# Patient Record
Sex: Male | Born: 1990 | Race: Black or African American | Hispanic: No | Marital: Single | Smoking: Current every day smoker
Health system: Southern US, Community
[De-identification: ages and names within clinical notes are randomized; demographics above are authoritative.]

## PROBLEM LIST (undated history)

## (undated) DIAGNOSIS — I456 Pre-excitation syndrome: Secondary | ICD-10-CM

## (undated) DIAGNOSIS — J45909 Unspecified asthma, uncomplicated: Secondary | ICD-10-CM

## (undated) HISTORY — PX: ATRIAL FLUTTER ABLATION: SHX5733

---

## 2016-02-19 ENCOUNTER — Emergency Department: Payer: Self-pay

## 2016-02-19 ENCOUNTER — Encounter: Payer: Self-pay | Admitting: Emergency Medicine

## 2016-02-19 ENCOUNTER — Emergency Department
Admission: EM | Admit: 2016-02-19 | Discharge: 2016-02-20 | Payer: Self-pay | Attending: Emergency Medicine | Admitting: Emergency Medicine

## 2016-02-19 DIAGNOSIS — J45909 Unspecified asthma, uncomplicated: Secondary | ICD-10-CM | POA: Insufficient documentation

## 2016-02-19 DIAGNOSIS — R55 Syncope and collapse: Secondary | ICD-10-CM

## 2016-02-19 DIAGNOSIS — I456 Pre-excitation syndrome: Secondary | ICD-10-CM | POA: Insufficient documentation

## 2016-02-19 DIAGNOSIS — F17210 Nicotine dependence, cigarettes, uncomplicated: Secondary | ICD-10-CM | POA: Insufficient documentation

## 2016-02-19 HISTORY — DX: Unspecified asthma, uncomplicated: J45.909

## 2016-02-19 HISTORY — DX: Pre-excitation syndrome: I45.6

## 2016-02-19 LAB — BASIC METABOLIC PANEL
ANION GAP: 12 (ref 5–15)
BUN: 15 mg/dL (ref 6–20)
CHLORIDE: 100 mmol/L — AB (ref 101–111)
CO2: 26 mmol/L (ref 22–32)
Calcium: 9.8 mg/dL (ref 8.9–10.3)
Creatinine, Ser: 1.12 mg/dL (ref 0.61–1.24)
GFR calc non Af Amer: >60 mL/min (ref >60)
Glucose, Bld: 82 mg/dL (ref 65–99)
POTASSIUM: 3.5 mmol/L (ref 3.5–5.1)
SODIUM: 138 mmol/L (ref 135–145)

## 2016-02-19 LAB — TROPONIN I

## 2016-02-19 MED ORDER — LORAZEPAM 2 MG/ML IJ SOLN
1.0000 mg | Freq: Once | INTRAMUSCULAR | Status: AC
  Administered 2016-02-19: 1 mg via INTRAVENOUS
  Filled 2016-02-19: qty 1

## 2016-02-19 MED ORDER — LORAZEPAM 2 MG/ML IJ SOLN
0.5000 mg | Freq: Once | INTRAMUSCULAR | Status: AC
  Administered 2016-02-19: 0.5 mg via INTRAVENOUS
  Filled 2016-02-19: qty 1

## 2016-02-19 NOTE — ED Triage Notes (Signed)
Per [facility]: Girtlfriend called 911 because patient passed out twice in kitchen making dinner, hx WPW, CBG 89, VS stable, sharp chest pain Left and center

## 2016-02-19 NOTE — ED Provider Notes (Signed)
[facility] Emergency Department Provider Note  ____________________________________________  Time seen: Approximately 11:25 PM  I have reviewed the triage vital signs and the nursing notes.   HISTORY  Chief Complaint Wolff-Parkinson-White Syndrome    HPI Zachary Perez is a 25 y.o. male brought to the ED due to syncope. He has a history of WPW, diagnosed when he was in high school and had some exertional cardiac symptoms he was seen in the hospital at that time, recommended to have an ablation, but never followed up. Tonight he was starting to cook some dinner, when he had sudden onset of left-sided chest pain, sharp, nonradiating, not pleuritic, followed quickly by syncope. After waking up he had syncope again. EMS were called and found the patient had recovered by the time they arrived. Twelve-lead EKG at that time was unremarkable.   Patient reports ongoing moderate chest pain     Past Medical History:  Diagnosis Date  . Asthma   . Wolff-Parkinson-White (WPW) syndrome      There are no active problems to display for this patient.    History reviewed. No pertinent surgical history.   Prior to Admission medications   Not on File     Allergies Patient has no known allergies.   No family history on file.  Social History Social History  Substance Use Topics  . Smoking status: Current Every Day Smoker    Packs/day: 0.50    Types: Cigarettes  . Smokeless tobacco: Never Used  . Alcohol use Not on file    Review of Systems  Constitutional:   No fever or chills.  ENT:   No sore throat. No rhinorrhea. Cardiovascular:   Positive as above chest pain. Respiratory:   No dyspnea or cough. Gastrointestinal:   Negative for abdominal pain, vomiting and diarrhea.  Neurological:   Negative for headaches 10-point ROS otherwise negative.  ____________________________________________   PHYSICAL EXAM:  VITAL SIGNS: ED Triage Vitals  Enc  Vitals Group     BP 02/19/16 2220 (!) 164/92     Pulse Rate 02/19/16 2220 77     Resp 02/19/16 2220 17     Temp 02/19/16 2220 98.8 F (37.1 C)     Temp Source 02/19/16 2220 Oral     SpO2 02/19/16 2220 100 %     Weight 02/19/16 2221 150 lb (68 kg)     Height 02/19/16 2221 6' (1.829 m)     Head Circumference --      Peak Flow --      Pain Score 02/19/16 2223 10     Pain Loc --      Pain Edu? --      Excl. in GC? --     Vital signs reviewed, nursing assessments reviewed.   Constitutional:   Alert and oriented. Well appearing and in no distress. Eyes:   No scleral icterus. No conjunctival pallor. PERRL. EOMI.  No nystagmus. ENT   Head:   Normocephalic and atraumatic.   Nose:   No congestion/rhinnorhea. No septal hematoma   Mouth/Throat:   MMM, no pharyngeal erythema. No peritonsillar mass.    Neck:   No stridor. No SubQ emphysema. No meningismus. Hematological/Lymphatic/Immunilogical:   No cervical lymphadenopathy. Cardiovascular:   RRR. Symmetric bilateral radial and DP pulses.  No murmurs.  Respiratory:   Normal respiratory effort without tachypnea nor retractions. Breath sounds are clear and equal bilaterally. No wheezes/rales/rhonchi. Chest wall nontender Gastrointestinal:   Soft and nontender. Non distended. There is no CVA  tenderness.  No rebound, rigidity, or guarding. Genitourinary:   deferred Musculoskeletal:   Nontender with normal range of motion in all extremities. No joint effusions.  No lower extremity tenderness.  No edema. Neurologic:   Normal speech and language.  CN 2-10 normal. Motor grossly intact. No gross focal neurologic deficits are appreciated.  Skin:    Skin is warm, dry and intact. No rash noted.  No petechiae, purpura, or bullae.  ____________________________________________    LABS (pertinent positives/negatives) (all labs ordered are listed, but only abnormal results are displayed) Labs Reviewed  BASIC METABOLIC PANEL  CBC WITH  DIFFERENTIAL/PLATELET  TROPONIN I   ____________________________________________   EKG  Interpreted by me Sinus tachycardia rate 119, normal axis and intervals. Pronounced alto wave on R wave upslope in inferior and anterolateral leads. Normal ST segments and T waves.  ____________________________________________    RADIOLOGY  Chest x-ray unremarkable  ____________________________________________   PROCEDURES Procedures CRITICAL CARE Performed by: Scotty CourtSTAFFORD, Mikeisha Lemonds   Total critical care time: 35 minutes  Critical care time was exclusive of separately billable procedures and treating other patients.  Critical care was necessary to treat or prevent imminent or life-threatening deterioration.  Critical care was time spent personally by me on the following activities: development of treatment plan with patient and/or surrogate as well as nursing, discussions with consultants, evaluation of patient's response to treatment, examination of patient, obtaining history from patient or surrogate, ordering and performing treatments and interventions, ordering and review of laboratory studies, ordering and review of radiographic studies, pulse oximetry and re-evaluation of patient's condition.  ____________________________________________   INITIAL IMPRESSION / ASSESSMENT AND PLAN / ED COURSE  Pertinent labs & imaging results that were available during my care of the patient were reviewed by me and considered in my medical decision making (see chart for details).  Patient presents with syncope 2, EKG diagnostic of WPW. Has ongoing chest pain. Vital signs now stabilized. No pharmacologic interventions at this time. Discussed with on-call [facility]ology for [facility], Dr. Dortha SchwalbeIngal,who notes the patient would not be able to receive adequate evaluation here [facility]. Discussed transfer with patient, who [facility]. I then discussed the patient's case with the cardiology  [facility], who accepts to med telemetry under service of [employer] N[employer]ols.      Clinical Course    ____________________________________________   FINAL CLINICAL IMPRESSION(S) / ED DIAGNOSES  Final diagnoses:  WPW syndrome  Syncope, unspecified syncope type       Portions of this note were generated with dragon dictation software. Dictation errors may occur despite best attempts at proofreading.    Sharman CheekPhillip Mallie Giambra, MD 02/19/16 [id number]

## 2016-02-20 LAB — CBC WITH DIFFERENTIAL/PLATELET
Basophils Absolute: 0.1 10*3/uL (ref 0–0.1)
Basophils Relative: 1 %
EOS ABS: 0.2 10*3/uL (ref 0–0.7)
Eosinophils Relative: 1 %
HEMATOCRIT: 46.7 % (ref 40.0–52.0)
HEMOGLOBIN: 15.3 g/dL (ref 13.0–18.0)
LYMPHS ABS: 3.6 10*3/uL (ref 1.0–3.6)
LYMPHS PCT: 27 %
MCH: 27.2 pg (ref 26.0–34.0)
MCHC: 32.8 g/dL (ref 32.0–36.0)
MCV: 82.9 fL (ref 80.0–100.0)
MONOS PCT: 7 %
Monocytes Absolute: 0.9 10*3/uL (ref 0.2–1.0)
NEUTROS ABS: 8.8 10*3/uL — AB (ref 1.4–6.5)
NEUTROS PCT: 64 %
Platelets: 184 10*3/uL (ref 150–440)
RBC: 5.63 MIL/uL (ref 4.40–5.90)
RDW: 13 % (ref 11.5–14.5)
WBC: 13.6 10*3/uL — AB (ref 3.8–10.6)

## 2016-02-20 MED ORDER — MORPHINE SULFATE (PF) 4 MG/ML IV SOLN
2.0000 mg | Freq: Once | INTRAVENOUS | Status: AC
  Administered 2016-02-20: 2 mg via INTRAVENOUS
  Filled 2016-02-20: qty 1

## 2016-02-20 NOTE — ED Notes (Signed)
MD Manson PasseyBrown spoke with patient about necessity to stay in acute care due to critical condition of patient and necessity of cardiology follow-up. Patient wanted to leave AMA, elope, but has agreed to stay following Md Manson PasseyBrown discussion.

## 2016-02-20 NOTE — ED Provider Notes (Signed)
I assumed care of the patient 11:30 PM from Dr. Scotty CourtStafford. I was informed by Dr. Scotty CourtStafford that the [facility] was [facility] and was awaiting transport via [facility] I was notified by the nursing staff that the patient wanted to leave AGAINST MEDICAL ADVICE and was very irate. I spoke with the patient at length regarding the potential dangers in his decision. After a lengthy conversation the patient agreed to be [facility]. Upon [facility] arrival the patient again stated that he wanted to leave AGAINST MEDICAL ADVICE secondary to the fact that he was not pleased with the EMS crew. Again I spoke with the patient at length and [facility] agreed [facility]. Shortly after leaving we received a radio contact from the [facility] crew that they were back in route to the emergency department as the patient was very irate and removing his seatbelt while in transport. [facility] stated that the patient told them "distraught me off on the side of road". Patient return to the emergency department very irate and verbally abusive to the EMS staff. I spoke with the patient who stated that he believed the "people were racists". Again I spoke with the patient at length regarding the importance of need for admission. I told him of the potential, severe life threatening consequences of leaving AMA. After very  long conversation patient agreed to be transferred by another EMS crew. As such I called [place].  Critical Care: CRITICAL CARE Performed by: Darci CurrentANDOLPH N BROWN   Total critical care time: 30 minutes  Critical care time was exclusive of separately billable procedures and treating other patients.  Critical care was necessary to treat or prevent imminent or life-threatening deterioration.  Critical care was time spent personally by me on the following activities: development of treatment plan with patient and/or surrogate as well as nursing, discussions with consultants,  evaluation of patient's response to treatment, examination of patient, obtaining history from patient or surrogate, ordering and performing treatments and interventions, ordering and review of laboratory studies, ordering and review of radiographic studies, pulse oximetry and re-evaluation of patient's condition.    Darci Currentandolph N Brown, MD 02/20/16 [phone]

## 2016-02-27 LAB — GLUCOSE, CAPILLARY: Glucose-Capillary: 82 mg/dL (ref 65–99)

## 2016-03-18 ENCOUNTER — Encounter: Payer: Self-pay | Admitting: Emergency Medicine

## 2016-03-18 ENCOUNTER — Emergency Department
Admission: EM | Admit: 2016-03-18 | Discharge: 2016-03-18 | Disposition: A | Discharge status: Home / Self Care | Payer: Self-pay | Attending: Emergency Medicine | Admitting: Emergency Medicine

## 2016-03-18 DIAGNOSIS — R1084 Generalized abdominal pain: Secondary | ICD-10-CM | POA: Insufficient documentation

## 2016-03-18 DIAGNOSIS — F17210 Nicotine dependence, cigarettes, uncomplicated: Secondary | ICD-10-CM | POA: Insufficient documentation

## 2016-03-18 DIAGNOSIS — J45909 Unspecified asthma, uncomplicated: Secondary | ICD-10-CM | POA: Insufficient documentation

## 2016-03-18 DIAGNOSIS — R112 Nausea with vomiting, unspecified: Secondary | ICD-10-CM | POA: Insufficient documentation

## 2016-03-18 DIAGNOSIS — R197 Diarrhea, unspecified: Secondary | ICD-10-CM | POA: Insufficient documentation

## 2016-03-18 LAB — COMPREHENSIVE METABOLIC PANEL
ALK PHOS: 49 U/L (ref 38–126)
ALT: 31 U/L (ref 17–63)
ANION GAP: 10 (ref 5–15)
AST: 48 U/L — ABNORMAL HIGH (ref 15–41)
Albumin: 5.1 g/dL — ABNORMAL HIGH (ref 3.5–5.0)
BILIRUBIN TOTAL: 0.8 mg/dL (ref 0.3–1.2)
BUN: 11 mg/dL (ref 6–20)
CALCIUM: 9.8 mg/dL (ref 8.9–10.3)
CO2: 24 mmol/L (ref 22–32)
Chloride: 102 mmol/L (ref 101–111)
Creatinine, Ser: 0.88 mg/dL (ref 0.61–1.24)
Glucose, Bld: 109 mg/dL — ABNORMAL HIGH (ref 65–99)
POTASSIUM: 4.1 mmol/L (ref 3.5–5.1)
Sodium: 136 mmol/L (ref 135–145)
TOTAL PROTEIN: 8.2 g/dL — AB (ref 6.5–8.1)

## 2016-03-18 LAB — CBC WITH DIFFERENTIAL/PLATELET
BASOS PCT: 0 %
Basophils Absolute: 0 10*3/uL (ref 0–0.1)
Eosinophils Absolute: 0 10*3/uL (ref 0–0.7)
Eosinophils Relative: 0 %
HEMATOCRIT: 46.3 % (ref 40.0–52.0)
HEMOGLOBIN: 15.3 g/dL (ref 13.0–18.0)
LYMPHS ABS: 0.2 10*3/uL — AB (ref 1.0–3.6)
Lymphocytes Relative: 1 %
MCH: 27.1 pg (ref 26.0–34.0)
MCHC: 33 g/dL (ref 32.0–36.0)
MCV: 82.2 fL (ref 80.0–100.0)
MONO ABS: 0.6 10*3/uL (ref 0.2–1.0)
MONOS PCT: 3 %
NEUTROS ABS: 17.6 10*3/uL — AB (ref 1.4–6.5)
NEUTROS PCT: 96 %
Platelets: 190 10*3/uL (ref 150–440)
RBC: 5.64 MIL/uL (ref 4.40–5.90)
RDW: 12.9 % (ref 11.5–14.5)
WBC: 18.5 10*3/uL — ABNORMAL HIGH (ref 3.8–10.6)

## 2016-03-18 LAB — TROPONIN I

## 2016-03-18 LAB — LIPASE, BLOOD: LIPASE: 14 U/L (ref 11–51)

## 2016-03-18 MED ORDER — DICYCLOMINE HCL 10 MG PO CAPS
10.0000 mg | ORAL_CAPSULE | Freq: Once | ORAL | Status: AC
  Administered 2016-03-18: 10 mg via ORAL
  Filled 2016-03-18: qty 1

## 2016-03-18 MED ORDER — ONDANSETRON HCL 4 MG PO TABS: 4.0000 mg | ORAL_TABLET | Freq: Three times a day (TID) | ORAL | 0 refills | Status: AC | PRN

## 2016-03-18 MED ORDER — ONDANSETRON 4 MG PO TBDP: 4.0000 mg | ORAL_TABLET | Freq: Once | ORAL | Status: DC

## 2016-03-18 MED ORDER — SODIUM CHLORIDE 0.9 % IV BOLUS (SEPSIS)
1000.0000 mL | Freq: Once | INTRAVENOUS | Status: AC
  Administered 2016-03-18: 1000 mL via INTRAVENOUS

## 2016-03-18 MED ORDER — ONDANSETRON HCL 4 MG/2ML IJ SOLN
4.0000 mg | Freq: Once | INTRAMUSCULAR | Status: AC
  Administered 2016-03-18: 4 mg via INTRAVENOUS
  Filled 2016-03-18: qty 2

## 2016-03-18 MED ORDER — DICYCLOMINE HCL 20 MG PO TABS
20.0000 mg | ORAL_TABLET | Freq: Three times a day (TID) | ORAL | 0 refills | Status: AC | PRN
Start: 2016-03-18 — End: ?

## 2016-03-18 NOTE — Discharge Instructions (Signed)
Please seek medical attention for any high fevers, chest pain, shortness of breath, change in behavior, persistent vomiting, bloody stool or any other new or concerning symptoms.  

## 2016-03-18 NOTE — ED Notes (Signed)
Reviewed d/c instructions, follow-up care and prescriptions with patient. Patient verbalized understanding. Pt went to second floor to visit friend/admitted patient.

## 2016-03-18 NOTE — ED Provider Notes (Signed)
[facility] Emergency Department Provider Note   ____________________________________________   I have reviewed the triage vital signs and the nursing notes.   HISTORY  Chief Complaint Generalized Body Aches and Nausea   History limited by: Not Limited   HPI Zachary Perez is a 25 y.o. male who presents to the emergency department today because of concern for nausea vomiting and diarrhea. The symptoms started this morning. They woke him from his sleep. Has had multiple episodes of both vomiting and diarrhea, both non bloody. Has been accompanied by some abdominal discomfort. Patient states he ate chili last night from a fast food store. The patient denies any fevers.   Past Medical History:  Diagnosis Date  . Asthma   . Wolff-Parkinson-White (WPW) syndrome     There are no active problems to display for this patient.   Past Surgical History:  Procedure Laterality Date  . ATRIAL FLUTTER ABLATION      Prior to Admission medications   Not on File    Allergies Patient has no known allergies.  No family history on file.  Social History Social History  Substance Use Topics  . Smoking status: Current Every Day Smoker    Packs/day: 0.50    Types: Cigarettes  . Smokeless tobacco: Never Used  . Alcohol use Not on file    Review of Systems  Constitutional: Negative for fever. Cardiovascular: Negative for chest pain. Respiratory: Negative for shortness of breath. Gastrointestinal: Positive for abdominal pain, vomiting and diarrhea. Genitourinary: Negative for dysuria. Musculoskeletal: Negative for back pain. Skin: Negative for rash. Neurological: Negative for headaches, focal weakness or numbness.  10-point ROS otherwise negative.  ____________________________________________   PHYSICAL EXAM:  VITAL SIGNS: ED Triage Vitals  Enc Vitals Group     BP 03/18/16 1441 138/66     Pulse Rate 03/18/16 1441 67     Resp 03/18/16 1441 16      Temp 03/18/16 1441 97.8 F (36.6 C)     Temp src --      SpO2 03/18/16 1441 100 %     Weight 03/18/16 1441 145 lb (65.8 kg)     Height 03/18/16 1441 6' (1.829 m)     Head Circumference --      Peak Flow --      Pain Score 03/18/16 1443 4     Pain Loc --      Pain Edu? --      Excl. in GC? --     Constitutional: Alert and oriented. Well appearing and in no distress. Eyes: Conjunctivae are normal. Normal extraocular movements. ENT   Head: Normocephalic and atraumatic.   Nose: No congestion/rhinnorhea.   Mouth/Throat: Mucous membranes are moist.   Neck: No stridor. Hematological/Lymphatic/Immunilogical: No cervical lymphadenopathy. Cardiovascular: Normal rate, regular rhythm.  No murmurs, rubs, or gallops.  Respiratory: Normal respiratory effort without tachypnea nor retractions. Breath sounds are clear and equal bilaterally. No wheezes/rales/rhonchi. Gastrointestinal: Soft and Diffusely minimally tender to palpation. No rebound. No guarding.  Genitourinary: Deferred Musculoskeletal: Normal range of motion in all extremities. No lower extremity edema. Neurologic:  Normal speech and language. No gross focal neurologic deficits are appreciated.  Skin:  Skin is warm, dry and intact. No rash noted. Psychiatric: Mood and affect are normal. Speech and behavior are normal. Patient exhibits appropriate insight and judgment.  ____________________________________________    LABS (pertinent positives/negatives)  Labs Reviewed  CBC WITH DIFFERENTIAL/PLATELET - Abnormal; Notable for the following:       Result Value  WBC 18.5 (*)    Neutro Abs 17.6 (*)    Lymphs Abs 0.2 (*)    All other components within normal limits  COMPREHENSIVE METABOLIC PANEL - Abnormal; Notable for the following:    Glucose, Bld 109 (*)    Total Protein 8.2 (*)    Albumin 5.1 (*)    AST 48 (*)    All other components within normal limits  LIPASE, BLOOD  TROPONIN I      ____________________________________________   EKG  I, Phineas SemenGraydon Ramesh Moan, attending physician, personally viewed and interpreted this EKG  EKG Time: 1447 Rate: 59 Rhythm: sinus bradycardia Axis: normal Intervals: qtc 388 QRS: narrow ST changes: no st elevation Impression: abnormal ekg  ____________________________________________    RADIOLOGY  None  ____________________________________________   PROCEDURES  Procedures  ____________________________________________   INITIAL IMPRESSION / ASSESSMENT AND PLAN / ED COURSE  Pertinent labs & imaging results that were available during my care of the patient were reviewed by me and considered in my medical decision making (see chart for details).  Patient presented to the emergency department today because of concerns for nausea vomiting and some abdominal discomfort. The patient's exam showed a somewhat diffuse mildly tender to palpation abdomen. No rebound. No guarding. Patient's blood work does show some leukocytosis. At this point I think viral gastroenteritis likely. Patient had recent cardiac ablation however EKG was within normal limits patient not complaining of any chest pain. I do not think that that likely is playing a role in today's symptoms. Patient did feel better after and I medics, Bentyl and fluids. Will discharge home with Bentyl and  ____________________________________________   FINAL CLINICAL IMPRESSION(S) / ED DIAGNOSES  Final diagnoses:  Nausea and vomiting, intractability of vomiting not specified, unspecified vomiting type     Note: This dictation was prepared with Dragon dictation. Any transcriptional errors that result from this process are unintentional     Phineas SemenGraydon Margarett Viti, MD 03/18/16 2036

## 2016-03-18 NOTE — ED Triage Notes (Addendum)
presents with n/v this am with body aches ..positive chills  Unsure of fever   But also having some chest discomfort  And feels like he can't breath  Was ok yesterday    Had recent cardiac ablation about 4 weeks ago

## 2018-08-08 IMAGING — DX DG HAND COMPLETE 3+V*R*
3 series · 3 of 3 positions shown · non-contrast
Comparison: None.

CLINICAL DATA: Hit right hand on refrigerator, with numbness at the
fourth and fifth digits. Initial encounter.

EXAM:
RIGHT HAND - COMPLETE 3+ VIEW

[hand ap]
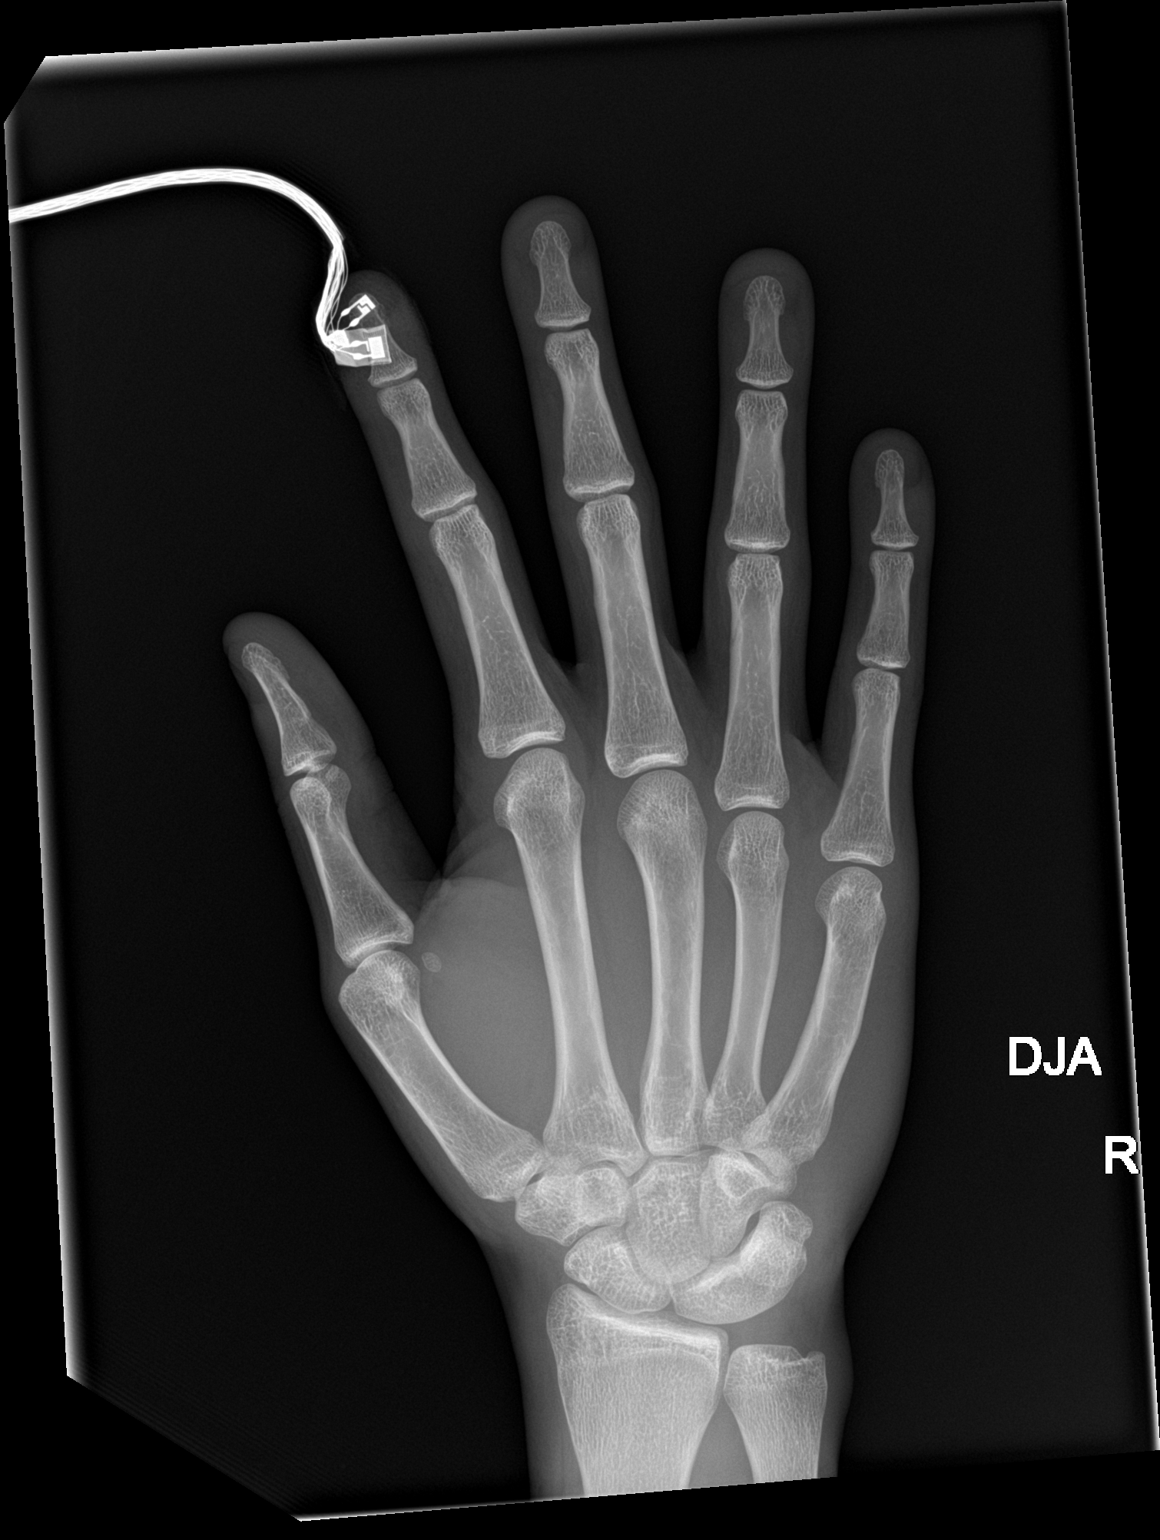

[hand obl]
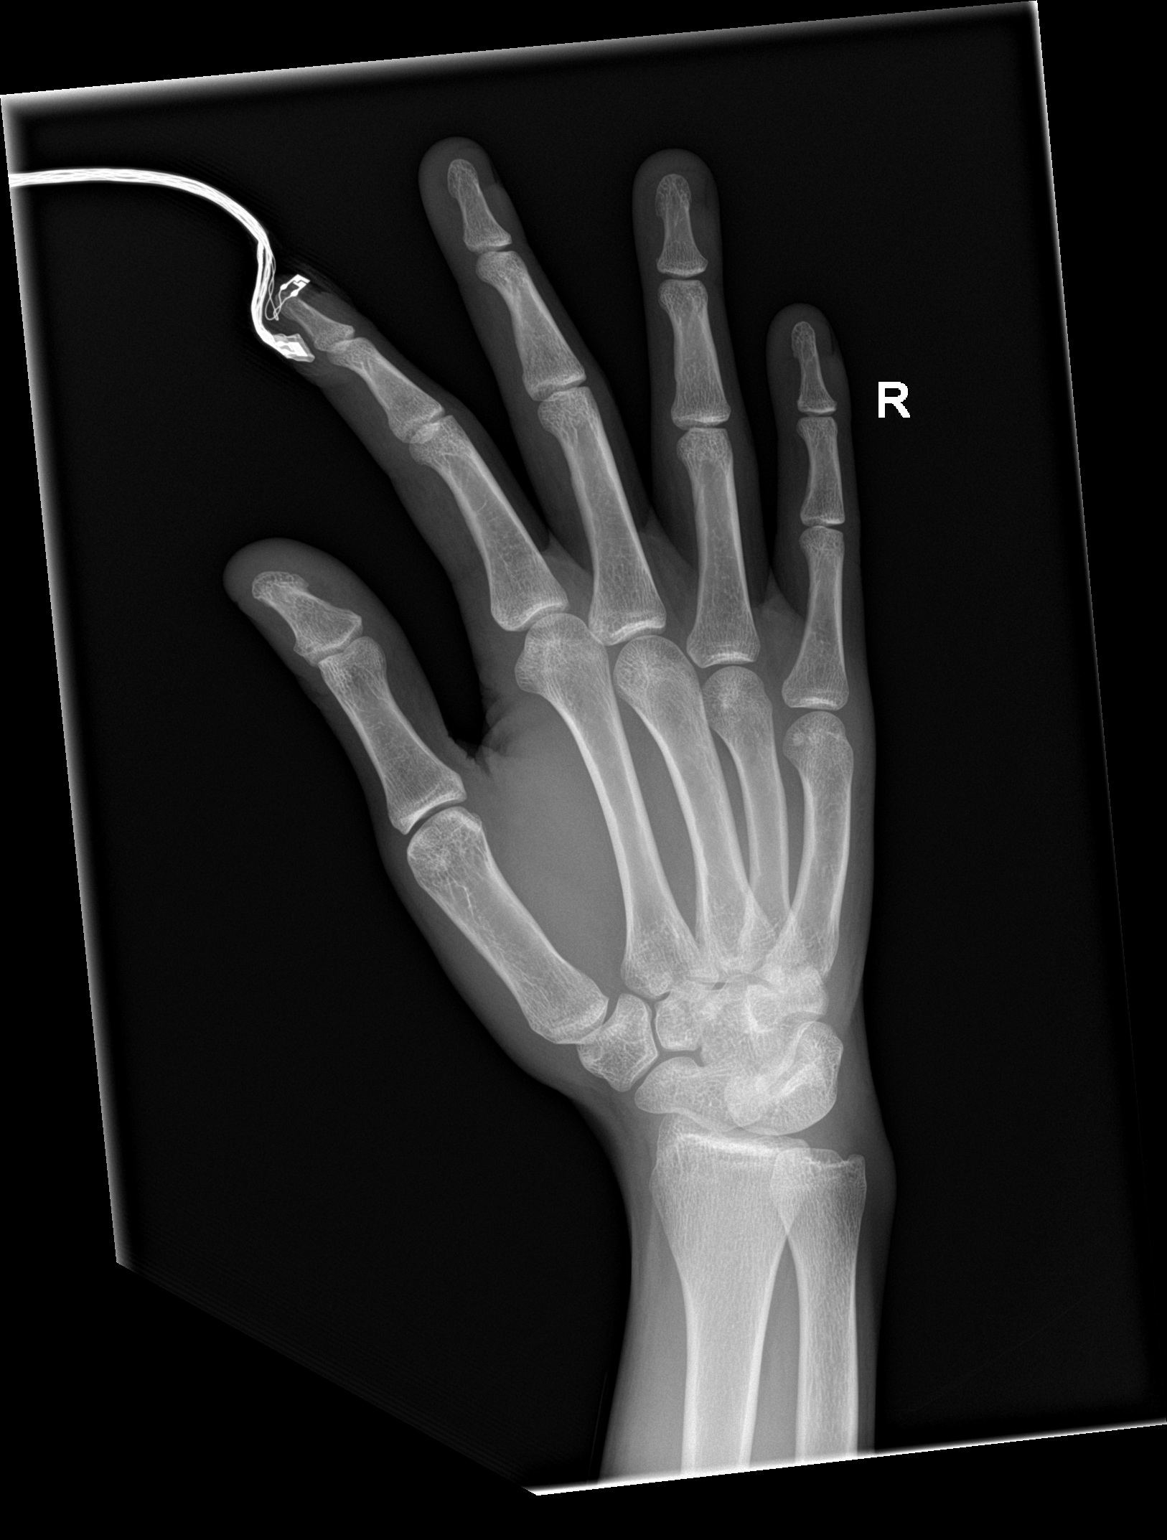

[hand lat]
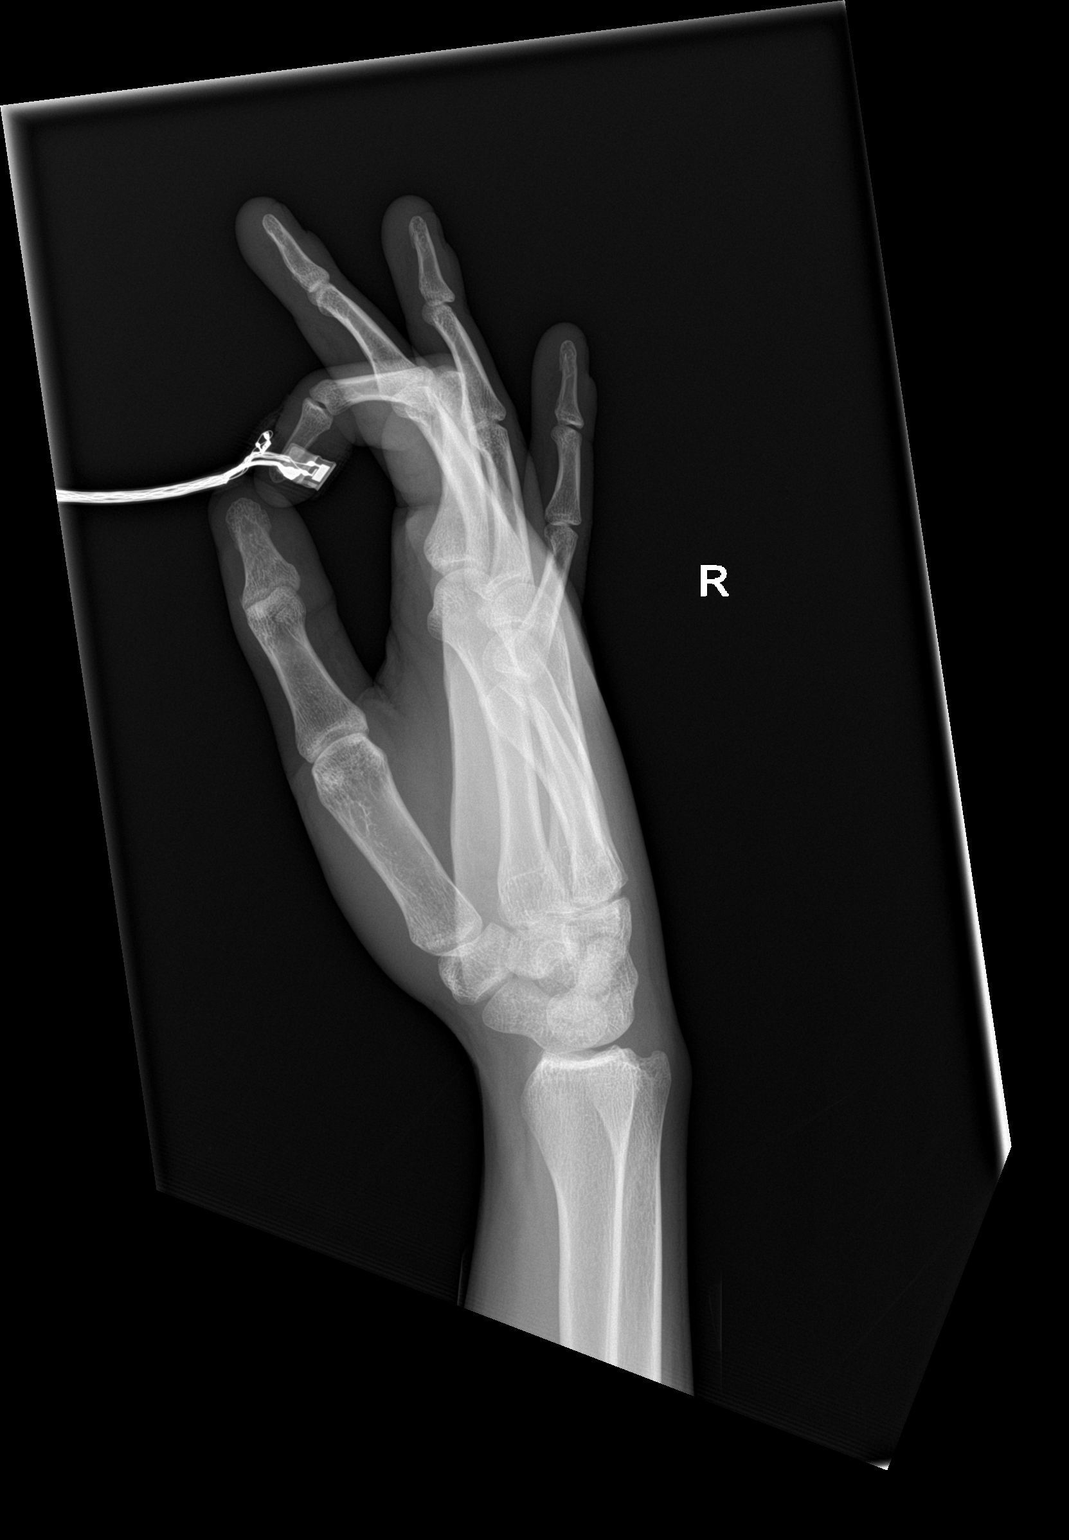

[3 of 3 positions shown; findings below may reference images not displayed]

FINDINGS: There is no evidence of fracture or dislocation. The joint spaces
are preserved. The carpal rows are intact, and demonstrate normal
alignment. The soft tissues are unremarkable in appearance. Mild
negative ulnar variance is noted.
IMPRESSION: No evidence of fracture or dislocation.

## 2018-08-08 IMAGING — DX DG CHEST 1V PORT
1 series · 1 of 1 positions shown · non-contrast
Comparison: None.

CLINICAL DATA: Syncope x2 today. History oval Parkinson's white
syndrome. Sharp left and central chest pain.

EXAM:
PORTABLE CHEST 1 VIEW

[chest ap]
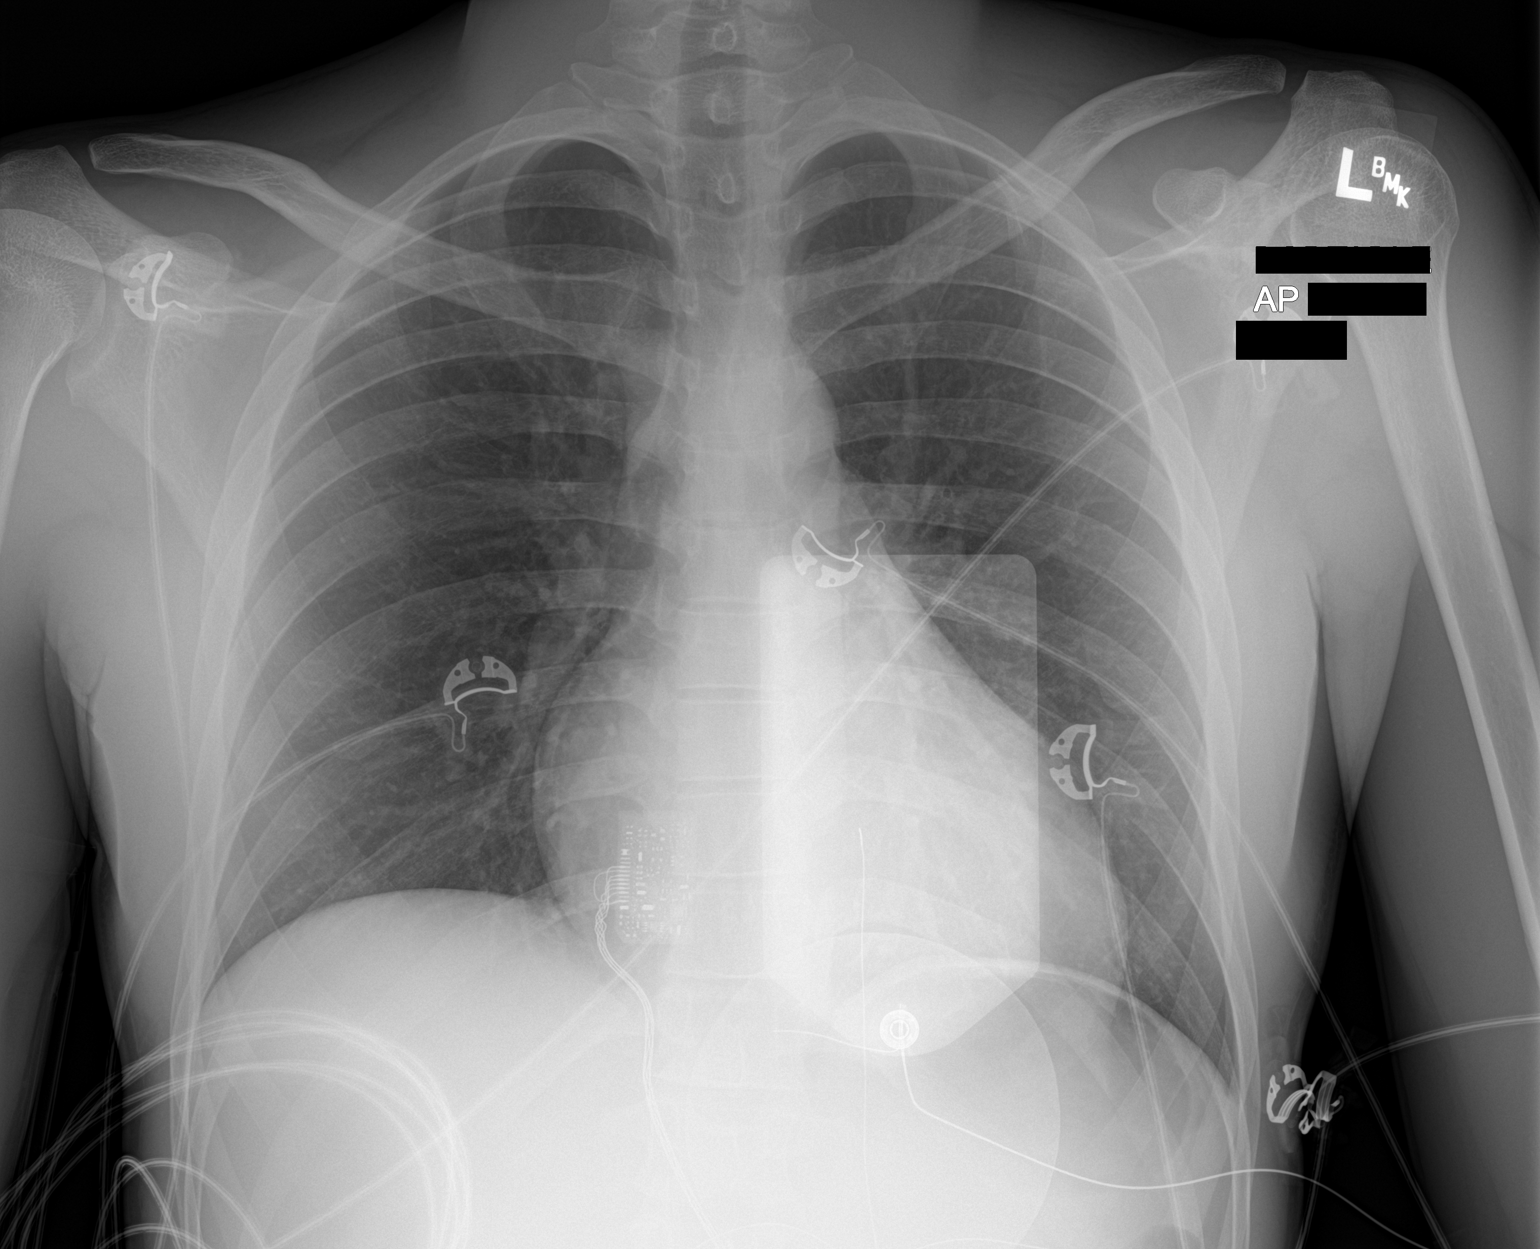

[1 of 1 positions shown; findings below may reference images not displayed]

FINDINGS: The heart size and mediastinal contours are within normal limits.
Both lungs are clear. The visualized skeletal structures are
unremarkable.
IMPRESSION: No active disease.

## 2019-05-12 ENCOUNTER — Inpatient Hospital Stay: Admit: 2019-05-12 | Discharge: 2019-05-12 | Payer: MEDICAID

## 2019-05-12 ENCOUNTER — Emergency Department: Admit: 2019-05-12 | Payer: PRIVATE HEALTH INSURANCE | Primary: Family

## 2019-05-12 DIAGNOSIS — R0602 Shortness of breath: Secondary | ICD-10-CM

## 2019-05-12 DIAGNOSIS — R42 Dizziness and giddiness: Secondary | ICD-10-CM

## 2019-05-13 DIAGNOSIS — J988 Other specified respiratory disorders: Secondary | ICD-10-CM

## 2019-05-13 DIAGNOSIS — Z79899 Other long term (current) drug therapy: Secondary | ICD-10-CM

## 2019-05-13 NOTE — Discharge Instructions
Please follow up with a Primary Care Physician within 1 week for re-evaluation of your symptoms. If you do not currently have one, try one of the following clinics: [facility] [phone] Center [phone] [facility] [phone] you have insurance, call your provider to find your nearest Primary Care Doctor. Return to the emergency department with any new or worsening symptoms including chest pain, cough, worsening difficulty breathing.Novel Coronavirus Disease 2019 (COVID-19)For any questions, please call [facility] COVID-19 Call Center at [phone], or toll-free at [phone].  Key PointsStay at home if you are sick. If you have questions about home quarantine or COVID-19, call the COVID-19 Call Center: [phone]. Please call ahead before visiting your doctor or any other healthcare facility.Wash your hands and cover your cough or sneeze. This is the best way to prevent spreading the disease. You can use a surgical facemask or cough/sneeze into your elbow. N-95 masks are not more effective and not necessary unless you are a Research scientist (physical sciences). Using or collecting N95 masks depletes critical supplies for hospitalized patients and healthcare workers. Seek further testing only if instructed by your doctor. Not everyone can or should be tested for COVID-19. Before you seek further testing, call the COVID-19 Call Center at [phone] for guidance. If you go to another emergency room or clinic for testing without guidance, you risk exposing yourself and others.  If you were prescribed medications, please take them as directed. You do not need antibiotics for COVID-19 treatment. If you are already taking medications at home, continue to take them as instructed by your prescribing physician(s).Return to the emergency room if you develop difficulty breathing or other severe symptoms. Call 911 for guidance first, mention your worsening COVID-19 symptoms, and return by private car or ambulance. Avoid using public transportation to limit exposure to others. Do NOT visit another healthcare facility without speaking with 911, your doctor or the COVID-19 Call Center.  Novel Coronavirus Disease 2019 (COVID-19)				            [id number]  ([id number])Novel Coronavirus Disease 2019 (COVID-19)What happened during my emergency department visit today?Either you or your provider were concerned about the novel coronavirus disease 2019 (COVID-19) during your visit today. This packet contains information and guidance about COVID-19. It also includes the names of your emergency room team, all the tests that were ordered, any available results, and a list of treatments given (if any) during your visit. Please note that your emergency room diagnosis is preliminary and may not explain all your symptoms since it is based on just a single visit. Your best chance of recovery is to continue safe healthcare practices and maintain contact with the appropriate providers for further guidance and follow-up as instructed in this packet. You should also take your prescribed medications and undergo any additional testing recommended by your physician(s). If these instructions do not make sense to you or your caregiver(s) or if you have any questions, please call [phone] or toll-free, [phone].What is COVID-19?COVID-19 stands for ?coronavirus disease 2019.? This is a respiratory illness that can spread from person to person. Like the flu or common cold, COVID-19 is spread by droplets produced by infected persons when they sneeze or cough. The risk of infection is thought to be greatest when you are in prolonged close contact with an infected person (such as a family member). While it may also be possible to catch the virus by touching an infected surface and then touching your nose or mouth, this is probably not the primary way that the  virus spreads. Because of this, practicing social distancing (such as avoiding travel, standing at least 6 feet away from other people, and avoiding shaking hands) is recommended. Masks should be worn in public, and in your home if you are unable to isolate yourself from others in your home.  Unlike the flu or common cold, the symptoms of COVID-19 tend to be more gradual. The most common symptoms are fever, cough and body aches. The elderly, immunocompromised and people with lung or heart disease are at the highest risk of becoming very ill. There is no specific antiviral treatment or vaccine for COVID-19. Antibiotics do not treat COVID-19 since it is viral, not bacterial. Most young and otherwise healthy people will get better at home in 1-2 weeks with supportive care, which includes rest, hydration and medicine to lower your fever such as acetaminophen (Tylenol) and ibuprofen (Motrin, Advil).What are my next steps?If you have further questions, call the [facility] COVID-19 Call Center at [phone] or toll-free at [phone]. Get the flu vaccine or a booster if you do not have one yet. Encourage your family members to do the same, especially if they are elderly.Stay at home if you are sick. If you need further guidance on home quarantine or have questions about COVID-19, call COVID-19 Call Center. Please call ahead before visiting your doctor.Wash your hands and cover your cough or sneeze. You can use a surgical facemask or cough/sneeze into your elbow. Dispose of tissues in a lined trash can and immediately wash your hands for at least 20 seconds. If soap and water are not available, you can use an alcohol-based hand sanitizer containing 60-95% alcohol, rubbing your hands together until they are dry. Soap and water are recommended for visibly soiled hands. Do not share dishes, bedding or other household items with others unless thoroughly washed. This is the best way to prevent spreading the disease. N-95 masks are NOT more effective than surgical facemasks and are only recommended for healthcare workers. Using or collecting N95 masks depletes critical supplies for hospitalized patients and healthcare workers. Seek further testing ONLY if instructed by your doctor. Not everyone can or should be tested for COVID-19. Before seeking further testing, call [facility] to see if you meet criteria. If you go to another emergency room or clinic for testing without guidance, you risk exposing yourself and others. Do NOT visit another healthcare facility without first speaking with your doctor or our follow-up office. Contact your primary care doctor to evaluate your progress and recovery if instructed to do so. If you do not have a primary care doctor, you can call [phone] for a [facility] physician, or [phone] for more information or assistance in selecting a doctor. [facility] members should call the [facility] COVID-19 hotline at [phone].Please avoid social stigma or discrimination against Asian Americans, infected individuals and those under quarantine. Stigma hurts everyone by creating fear, anger and anxiety towards ordinary people without treating or preventing the underlying disease.What should I watch out for in the next few days?Currently, there is no approved treatment for COVID-19. If you start to develop any of the symptoms listed below, or your current symptoms worsen, call your healthcare provider or the COVID-19 Call Center for guidance. Call 911 if it is a medical emergency. Worsening fatigue/malaiseShortness of breathInability to keep fluids downNoticing darkened or decreased urine outputConfusion or altered mental statusAny other concerning symptomsWhere can I find more information about COVID-19?Since this is a new disease caused by a new virus,  scientists and researchers are discovering new information every day. We strongly encourage you to check for updates from these official sources:The Centers for Disease Control and Prevention (CDC) COVID-19 https://dennis.info/ World Health Organization (WHO) Q&A on COVID-19https://www.who.int/news-room/q-a-detail/q-a-coronavirusesY[facility] http://morrow-smith.net/ [facility] COVID-19 Vaccination GenitalDoctor.no.aspx[employer] [email or web address].com.br

## 2019-05-13 NOTE — ED Notes
7:05 PM Ryan [name], RN28 yo male BIBA to [facility] ED with c/o of SOB/chest discomfort. Pt has hx of WPW. Pt states that he was wathcing today today when he got a tight feeling in his chest. Pt states that this has not happened before. Pt states that he also got a feeling of dizziness when he stood up. Pt states that he got anxious and called to come into the ED. Pt is alert and speaking in complete sentences. Pts O2 sat was seen to be 100% on room air but pt asked to be placed on oxygen for comfort. Pt on 1L NC. Past Medical History: Diagnosis Date ? WPW (Wolff-Parkinson-White syndrome)  7:16 PMReport given to oncoming RN.

## 2019-05-13 NOTE — ED Notes
8:41 PM Pt clear for discharge per provider. Pt released in care of self, ambulatory with steady gait on departure.

## 2019-05-15 NOTE — ED Provider Notes
HistoryChief Complaint Patient presents with ? Shortness of Breath   pt biba from home c/o onset sob with lightheadedness x 1 hour while sitting on cough denies cough hx wpw and ablation  ? Dizziness  29 year old male with history of Wolff-Parkinson-White s/p ablation presenting with shortness of breath.  States he was in his usual state of health this afternoon, took a nap and reportedly had a dream where he was feeling short of breath which woke him up.  States he continues to feel like he can not catch his breath.  Denies associated chest pain, cough, palpitations, nausea, vomiting, abdominal pain.  Reports he smokes marijuana, last use yesterday.  No lower extremity pain or swelling. The history is provided by the patient and medical records. OtherThis is a new problem. The current episode started 1 to 2 hours ago. The problem occurs constantly. The problem has not changed since onset.Associated symptoms include shortness of breath. Pertinent negatives include no chest pain, no abdominal pain and no headaches. He has tried nothing for the symptoms.  Past Medical History: Diagnosis Date ? WPW (Wolff-Parkinson-White syndrome)  No past surgical history on file.No family history on file.Social History Socioeconomic History ? Marital status: Single   Spouse name: Not on file ? Number of children: Not on file ? Years of education: Not on file ? Highest education level: Not on file Tobacco Use ? Smoking status: Never Smoker ED Other Social History E-cigarette/Vaping Substances E-cigarette/Vaping Devices Review of Systems Respiratory: Positive for shortness of breath.  Cardiovascular: Negative for chest pain. Gastrointestinal: Negative for abdominal pain. Neurological: Negative for headaches. All other systems reviewed and are negative. Physical ExamED Triage Vitals [05/12/19 1820]BP: 137/73Pulse: 86Pulse from O2 sat: n/aResp: 18Temp: 97.7 ?F (36.5 ?C)Temp src: OralSpO2: 100 % BP (!) 113/53  - Pulse (!) 56  - Temp 97.8 ?F (36.6 ?C) (Oral)  - Resp 16  - Wt 65.3 kg  - SpO2 100% Physical ExamVitals signs and nursing note reviewed. Constitutional:     General: He is not in acute distress.   Appearance: He is not toxic-appearing. HENT:    Head: Normocephalic and atraumatic. Eyes:    General: No scleral icterus.Neck:    Musculoskeletal: Normal range of motion. Cardiovascular:    Rate and Rhythm: Normal rate and regular rhythm.    Heart sounds: No murmur. Pulmonary:    Effort: Pulmonary effort is normal.    Breath sounds: Normal breath sounds. No decreased breath sounds, wheezing, rhonchi or rales.    Comments: Breathing comfortably on room air.  There is no respiratory distress or increased work of breathing.  O2 sat 100% on room air at rest and with ambulation. Abdominal:    Tenderness: There is no abdominal tenderness. There is no guarding or rebound. Musculoskeletal:    Right lower leg: He exhibits no tenderness. No edema.    Left lower leg: He exhibits no tenderness. No edema. Skin:   General: Skin is warm and dry. Neurological:    Mental Status: He is alert and oriented to person, place, and time. Psychiatric:       Mood and Affect: Mood is not anxious.  Procedures12 Lead EKGDate/Time: 05/12/2019 7:53 PMPerformed by: Lauris Poag, PAAuthorized by: Lauris Poag, PA Previous ECG:   Previous ECG:  Compared to current  Comparison ECG info:  12/19/17  Similarity:  No changeInterpretation:   Interpretation: abnormal  Rate:   ECG rate:  71  ECG rate assessment: normal  Rhythm:   Rhythm: sinus rhythm  Ectopy:  Ectopy: none  ST segments:   ST segments:  NormalOther findings:   Other findings: delta wave  Resident/APP ZOX:WRUEAVWUJW:29 year old male with history of Wolff-Parkinson-White s/p ablation presenting with shortness of breath. Differential Diagnosis:  Arrhythmia, pneumothorax, anxiety, substance use, doubt ACS, doubt PE Wells' Criteria for Pulmonary Embolism - MDCalcCalculated on May 12 2019 8:01 PM0.0 points -> Low risk group: 1.3% chance of PE in an ED population. Another study assigned scores = 4 as PE Unlikely and had a 3% incidence of PE.PERC Rule for Pulmonary Embolism - MDCalcCalculated on May 12 2019 8:02 PM0 criteria -> No need for further workup, as <2% chance of PE. If no criteria are positive and clinician's pre-test probability is <15%, PERC Rule criteria are satisfied.Plan: EKG, chest x-rayResults/course:EKG without ischemic changes.  Delta wave without other arrhythmia.Chest x-ray clear.He is very well-appearing.  His ambulatory sat is 100% on room air.  Will discharge home. Outpatient COVID testing ordered.  Recommend PMD follow-up.  Strict return precautions given including he develops chest pain or cough.  Patient verbalized understanding and agreement plan.Case discussed with Attending Provider: Duffy Rhody, Bobbye Riggs, MD [phone] Lauris Poag, PA-C ED COURSEPatient Reevaluation: ED Attestation: PA/APRNFace to face evaluation was performed by me in collaboration with the Advanced Practice Provider to assess for significant health threats.Patient presents emergency room with a mild episode of shortness of breathNo palpitationHistory of WPW - EKG reassuringChest x-ray clearHemodynamics stableReinier Sandria Bales TonderClinical Impressions as of May 14 2031 Shortness of breath Viral respiratory infection - COVID-19 not excluded  ED DispositionDischarge Lauris Poag, PA02/10/21 2019 Lauris Poag, PA02/10/21 2024 Duffy Rhody, Bobbye Riggs, MD02/12/21 2033

## 2020-02-18 ENCOUNTER — Inpatient Hospital Stay: Admit: 2020-02-18 | Discharge: 2020-02-18 | Payer: PRIVATE HEALTH INSURANCE

## 2020-02-18 ENCOUNTER — Emergency Department: Admit: 2020-02-18 | Payer: PRIVATE HEALTH INSURANCE | Primary: Family

## 2020-02-18 DIAGNOSIS — S6991XA Unspecified injury of right wrist, hand and finger(s), initial encounter: Secondary | ICD-10-CM

## 2020-02-18 DIAGNOSIS — Z5321 Procedure and treatment not carried out due to patient leaving prior to being seen by health care provider: Secondary | ICD-10-CM

## 2020-02-19 NOTE — Telephone Encounter
Pt LWBS from ED visit,nAttempted to contact to discuss and assure follow up, NA, left VM to return call.

## 2020-03-13 ENCOUNTER — Emergency Department: Admit: 2020-03-13 | Payer: PRIVATE HEALTH INSURANCE | Primary: Family

## 2020-03-13 ENCOUNTER — Inpatient Hospital Stay: Admit: 2020-03-13 | Discharge: 2020-03-14 | Payer: MEDICAID

## 2020-03-13 DIAGNOSIS — R202 Paresthesia of skin: Secondary | ICD-10-CM

## 2020-03-13 DIAGNOSIS — F32A Depression, unspecified: Secondary | ICD-10-CM

## 2020-03-13 MED ORDER — ACETAMINOPHEN 325 MG TABLET
325 mg | Freq: Once | ORAL | Status: CP
Start: 2020-03-13 — End: ?
  Administered 2020-03-14: 04:00:00 325 mg via ORAL

## 2020-03-14 DIAGNOSIS — F419 Anxiety disorder, unspecified: Secondary | ICD-10-CM

## 2020-03-14 DIAGNOSIS — S52601A Unspecified fracture of lower end of right ulna, initial encounter for closed fracture: Secondary | ICD-10-CM

## 2020-03-14 NOTE — Telephone Encounter
Courtesy phone call made to discuss how pt is doing  and discuss f/u needs.Patient has made an apt. With plastics for tomorrow. He was given the number for [facility]  To establish a PCP. Patient was appreciative.

## 2020-03-14 NOTE — Discharge Instructions
-  Please return to the Emergency Department if you develop any new, worsening, or concerning symptoms. You may always return to the ED if you have any questions or concerns.-Please follow up with your primary care provider as discussed-It was a pleasure taking care of you today. Be sure to return if you develop chest pain, difficulty breathing, lightheadedness, dizziness, fever, sweats, or chills.
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
## 2020-03-15 ENCOUNTER — Telehealth: Admit: 2020-03-15 | Payer: PRIVATE HEALTH INSURANCE | Attending: Plastic Surgery | Primary: Family

## 2020-03-15 ENCOUNTER — Encounter: Admit: 2020-03-15 | Payer: PRIVATE HEALTH INSURANCE | Attending: Plastic Surgery | Primary: Family

## 2020-03-15 ENCOUNTER — Ambulatory Visit: Admit: 2020-03-15 | Payer: MEDICAID | Attending: Plastic Surgery | Primary: Family

## 2020-03-15 DIAGNOSIS — I456 Pre-excitation syndrome: Secondary | ICD-10-CM

## 2020-03-15 DIAGNOSIS — F4321 Adjustment disorder with depressed mood: Secondary | ICD-10-CM

## 2020-03-15 DIAGNOSIS — S62114A Nondisplaced fracture of triquetrum [cuneiform] bone, right wrist, initial encounter for closed fracture: Secondary | ICD-10-CM

## 2020-03-15 MED ORDER — IBUPROFEN 600 MG TABLET
600 mg | ORAL_TABLET | Freq: Four times a day (QID) | ORAL | 1 refills | Status: AC | PRN
Start: 2020-03-15 — End: 2020-04-03

## 2020-03-15 MED ORDER — IBUPROFEN 600 MG TABLET
600 mg | Status: AC
Start: 2020-03-15 — End: ?

## 2020-03-15 MED ORDER — DIAZEPAM 2 MG TABLET
2 mg | ORAL_TABLET | Freq: Every evening | ORAL | 1 refills | Status: AC | PRN
Start: 2020-03-15 — End: 2020-06-13

## 2020-03-15 NOTE — Telephone Encounter
Note isn't completed yet.-pls advise

## 2020-03-15 NOTE — Telephone Encounter
[facility] MESSAGETime of call:   3:56 PMCaller:   Ryan [name]   Reason for call:   Patient called went to pharmacy for prescription they gave him ibuprofen presciption and stated it was suppose to be valium as well.  He only received one prescription. Best telephone number for callback:   [phone]  [facility] Referral Specialist

## 2020-03-16 ENCOUNTER — Encounter: Admit: 2020-03-16 | Payer: PRIVATE HEALTH INSURANCE | Attending: Plastic Surgery | Primary: Family

## 2020-03-16 DIAGNOSIS — I456 Pre-excitation syndrome: Secondary | ICD-10-CM

## 2020-03-16 NOTE — Progress Notes
[facility]    [facility] Plastic Surgery,  Hand and Upper Extremity Surgery    CC:  Right wrist pain, ulnar styloid a triquetral fracture  DOI:  3 weeks ago, for 1 week ago    HPI: Patient is a 29 y.o. RHD Male  who presents for initial evaluation of right wrist pain secondary to injury work 3 weeks ago, additionally patient sustained a fall last week.  He was seen in the emergency department, he was found to have an ulnar styloid fracture.  A splint is helped.  Pt is going through a difficult time in his personal life. He recently loss his twins and he would like to meet with a psychiatrist.    Location:  Right wrist  Severity: 4/10  Quality:  Throbbing  Associated Symptoms:  Swelling, patient with wrist flexion    PMH:  Past Medical History:   Diagnosis Date   ? WPW (Wolff-Parkinson-White syndrome)          PSH:No past surgical history on file.    Meds:   Prior to Admission medications    Medication Sig Start Date End Date Taking? Authorizing Provider   ibuprofen (ADVIL,MOTRIN) 600 mg tablet  02/18/20  Yes Provider, Historical   ibuprofen (ADVIL,MOTRIN) 600 mg tablet Take 1 tablet (600 mg total) by mouth every 6 (six) hours as needed. 03/15/20   Alexxia Stankiewicz M, PA   lidocaine-prilocaine (EMLA) cream Apply topically as needed  Patient not taking: Reported on 03/15/2020 07/08/17   Louis Matte, MD   ondansetron (ZOFRAN) 4 MG tablet Take 1 tablet (4 mg total) by mouth every 6 (six) hours  Patient not taking: Reported on 03/15/2020 07/08/17   Louis Matte, MD       SH:  Social History     Tobacco Use   ? Smoking status: Never Smoker   Substance Use Topics   ? Alcohol use: Not on file   ? Drug use: Not on file       ALLERGIES: Patient has no known allergies.    ROS: Review of systems reviewed in support section of the chart, see EPIC charts.    PE:  Constitutional:   Vitals:    03/15/20 1011   BP: 138/84   Pulse: 68      Awake, alert, NAD  Eyes: Clear conjuctivae, pupils equal and reactive  Respiratory: lungs clear to auscultation, normal respiratory effort  Cardiovascular: Regular heartrate, no peripheral edema  Skin: No rashes, ulcers, or nodules  Psych: Appropriate affect. Awake and alert to situation  Right wrist:  Skin intact without lesions, no erythema, mild to moderate swelling  Tenderness over to triquetral.  No tenderness to palpation over the ulnar styloid  Pain with wrist deviation  SILT m/r/u nerve distributions  AIN/PIN/IO 5/5  Radial pulse 2+ fingers wwp              Imaging:   Upon reviewed x-rays, there is a small fragment alone triquetrum that is consistent with a recent fracture.     A/P:  Patient is a 29 y.o. Male with presents for initial evaluation of right wrist pain.  -given findings in physical exam, pt was placed on a short arm cast.  -he was referred to a psychiatrist to help him with is grief   -follow up in 1 month with repeat x-ray  - xrays at next visit yes  -45 minutes was spent in face-to-face time  with this patient

## 2020-04-01 ENCOUNTER — Encounter: Admit: 2020-04-01 | Payer: PRIVATE HEALTH INSURANCE | Attending: Plastic Surgery | Primary: Family

## 2020-04-03 MED ORDER — IBUPROFEN 600 MG TABLET
600 mg | ORAL_TABLET | 1 refills | Status: AC
Start: 2020-04-03 — End: ?

## 2020-04-19 ENCOUNTER — Encounter: Admit: 2020-04-19 | Payer: MEDICAID | Attending: Plastic Surgery | Primary: Family

## 2020-04-26 ENCOUNTER — Encounter: Admit: 2020-04-26 | Payer: MEDICAID | Attending: Plastic Surgery | Primary: Family

## 2020-06-12 ENCOUNTER — Encounter: Admit: 2020-06-12 | Payer: PRIVATE HEALTH INSURANCE | Attending: Plastic Surgery | Primary: Family

## 2020-06-13 MED ORDER — DIAZEPAM 2 MG TABLET
2 mg | ORAL_TABLET | Freq: Every evening | ORAL | 1 refills | Status: AC | PRN
Start: 2020-06-13 — End: ?

## 2020-07-10 ENCOUNTER — Encounter: Admit: 2020-07-10 | Payer: PRIVATE HEALTH INSURANCE | Attending: Plastic Surgery | Primary: Family

## 2020-08-20 ENCOUNTER — Encounter: Admit: 2020-08-20 | Payer: PRIVATE HEALTH INSURANCE | Attending: Surgical | Primary: Family

## 2020-09-10 ENCOUNTER — Inpatient Hospital Stay: Admit: 2020-09-10 | Discharge: 2020-09-10 | Payer: MEDICAID | Attending: Emergency Medicine

## 2020-09-10 NOTE — Discharge Instructions
Dear Levy Sjogren, Thank you for coming in to the emergency room today. As we discussed, please work on the following things to best manage your current conditions:- Continue to take ibuprofen, Tylenol, and the lidocaine jelly as needed for pain relief. - Please call [phone] tomorrow morning to be seen urgently by our dentist. They will help you with treatment and pain relief until you can be seen by your dentist later this month. It was a pleasure seeing you today. Best,Dr. Theresa Mulligan

## 2020-09-10 NOTE — ED Notes
6:31 AM BIBA from home for right sided dental pain. Pt states that he is unable to see a dentist for 2 weeks. Pt reports that he complelted abx  3 days ago, motrin and lidocaine gel just aint cutting it anymore I used up all my ibuprofen denies fever. C/o right ear pain. Clear speech. Facial symmetrical. Drool controlled.

## 2020-09-28 NOTE — ED Provider Notes
HistoryChief Complaint Patient presents with ? Dental Pain   tooth pain, filling fell out   The history is provided [name] the patient. Dental PainLocation:  LowerQuality:  ThrobbingSeverity:  SevereOnset quality:  GradualDuration:  2 weeksTiming:  IntermittentProgression:  WorseningChronicity:  NewContext: filling fell out  Context: not abscess and not trauma  Previous work-up:  Filled cavityRelieved [name]:  NSAIDs, topical anesthetic gel and acetaminophenWorsened [name]:  Cold food/drink, hot food/drink, pressure and touchingAssociated symptoms: facial pain, gum swelling, headaches and oral bleeding  Associated symptoms: no fever  Risk factors: no alcohol problem, no chewing tobacco use and no smoking   Past Medical History: Diagnosis Date ? WPW (Wolff-Parkinson-White syndrome)  No past surgical history on file.No family history on file.Social History Socioeconomic History ? Marital status: Single Tobacco Use ? Smoking status: Never Smoker ED Other Social History E-cigarette/Vaping Substances E-cigarette/Vaping Devices Review of Systems Constitutional: Negative for appetite change, chills and fever. HENT: Positive for dental problem. Negative for sinus pain.  Eyes: Negative.  Respiratory: Negative for shortness of breath.  Cardiovascular: Negative for chest pain. Gastrointestinal: Negative for abdominal pain. Genitourinary: Negative.  Musculoskeletal: Negative.  Skin: Negative.  Allergic/Immunologic: Negative.  Neurological: Positive for headaches. Hematological: Negative.  Psychiatric/Behavioral: Negative.   Physical ExamED Triage Vitals [09/10/20 0625]BP: (!) 146/99Pulse: (!) 56Pulse from  O2 sat: n/aResp: 18Temp: 98 ?F (36.7 ?C)Temp src: OralSpO2: 99 % BP (!) 146/99  - Pulse (!) 56  - Temp 98 ?F (36.7 ?C) (Oral)  - Resp 18  - SpO2 99% Physical ExamVitals reviewed. Constitutional:     Appearance: Normal appearance. HENT:    Head: Normocephalic and atraumatic.    Right Ear: Tympanic membrane normal.    Left Ear: Tympanic membrane normal.    Nose: Nose normal.    Mouth/Throat:    Mouth: Mucous membranes are moist.    Pharynx: Posterior oropharyngeal erythema present. Cardiovascular:    Rate and Rhythm: Normal rate and regular rhythm.    Pulses: Normal pulses.    Heart sounds: Normal heart sounds. Pulmonary:    Effort: Pulmonary effort is normal.    Breath sounds: Normal breath sounds. Abdominal:    General: Abdomen is flat.    Palpations: Abdomen is soft. Musculoskeletal:       General: Normal range of motion.    Cervical back: Normal range of motion and neck supple. Skin:   General: Skin is warm and dry.    Capillary Refill: Capillary refill takes less than 2 seconds. Neurological:    General: No focal deficit present.    Mental Status: He is alert and oriented to person, place, and time. Mental status is at baseline. Psychiatric:       Mood and Affect: Mood normal.       Behavior: Behavior normal.  ProceduresDental procedureDate/Time: 09/10/2020 8:52 AMPerformed [name]: Ryan [name], Ryan [name]: Nelida Gores, MD Indications:   Indications: dental pain  Location:   Block type:  Inferior alveolar  Laterality:  RightProcedure details:   Syringe type:  Controlled syringe  Needle gauge:  27 G  Anesthetic injected:  Bupivacaine 0.25% w/o epi  Injection procedure:  Anatomic landmarks identified, introduced needle, incremental injection and anatomic landmarks palpatedPost-procedure details:   Outcome:  Pain relieved  Patient tolerance of procedure:  Tolerated well, no immediate complicationsComments:    Temporary filling placed with pulpdent. Pt tolerated procedure well.==Procedure(s): I was present and supervised the key portions of the procedure.Gaylyn Lambert [place] [phone]  Mobile HeartBeatResident/APP MDM:Ryan [name] is a 30 yo  M w/ a PMH of WPW who is presenting with a two-week history of dental pain.The patient states that he was eating boneless chicken 2 weeks ago when a tooth filling fell out. The filling was in one of his back right lower molars and was placed in childhood. He called his dentist in [place] to make an appointment but he will not be able to see him until 6/26. A few days after the filling fell out, he started to experience pain, swelling, a little bleeding from the site, and associated headaches. He called his PCP who saw him via telehealth (no physical exam done). He prescribed the patient 10 pills of Keflex, 0.2% lidocaine jelly, and ibuprofen. The patient says that his pain was greatly reduced while he was on the Keflex. After finishing the Keflex, the pain came back greater than before. Although the ibuprofen was initially relieving his pain, the pain increased, so he started taking some occasional Tylenol along with the pain. The combination along with the lidocaine is no longer controlling his pain. He rates his pain as a 10/10. He is concerned that he cannot go on like this until the 26th when he will be seen [name] his dentist. Physical exam demonstrates lost filling, associated swelling and erythema. No evidence of abscess. Unlikely infection given lack of fluctuance, pus, fevers and other systemic symptoms. 7:40 AM Staffed with senior resident. 8:30 AM Inferior alveolar nerve block. Patient with near immediate relief of pain, very happy 8:45 AM Lime-lite light cure cavity liner applied. Spoke with dentistry resident on call. She said the patient can call [phone] tomorrow morning to be seen urgently in clinic. They will work to control his pain until he can be seen for definitive treatment [name] his regular dentist later this month. ED COURSEPatient Reevaluation: Attending Supervised: ResidentI saw and examined the patient. I agree with the findings and plan of care as documented in the resident's note.PMH inc WPW. Two weeks ago, lower right molar filling fell out. Dentist can't see him until 6/26. PCP saw him on tele-health and prescribed Keflex and pain medication.Dentin exposed; filled with paste. Dental block placed with good relief. Dentistry consulted and will see patient tomorrow.Return precautions discussed.Gaylyn Lambert [place] [phone]  Mobile HeartBeatClinical Impressions as of 09/10/20 1008 Tooth pain  ED DispositionDischarge Nelida Gores, MD06/12/22 0109 Roseanne Kaufman, MDResident06/12/22 1008 Nelida Gores, MD06/30/22 [phone]

## 2021-11-19 ENCOUNTER — Encounter: Admit: 2021-11-19 | Payer: PRIVATE HEALTH INSURANCE | Primary: Family

## 2021-11-20 ENCOUNTER — Telehealth: Admit: 2021-11-20 | Payer: PRIVATE HEALTH INSURANCE | Attending: Nursing | Primary: Family

## 2021-11-20 NOTE — Telephone Encounter
Left a message informing pt, we will not be able to evaluate him at this time as intended recipient was not seen in clinic for evaluation.

## 2022-01-22 ENCOUNTER — Inpatient Hospital Stay: Admit: 2022-01-22 | Discharge: 2022-01-23 | Payer: MEDICAID | Attending: Emergency Medicine | Primary: Family

## 2022-01-22 MED ORDER — HYDROXYZINE HCL 25 MG TABLET
25 mg | ORAL_TABLET | Freq: Three times a day (TID) | ORAL | 1 refills | Status: AC | PRN
Start: 2022-01-22 — End: ?

## 2022-01-22 MED ORDER — HYDROXYZINE HCL 25 MG TABLET
25 mg | Freq: Once | ORAL | Status: CP
Start: 2022-01-22 — End: ?
  Administered 2022-01-23: 04:00:00 25 mg via ORAL

## 2022-01-22 MED ORDER — PERMETHRIN 5 % TOPICAL CREAM
5 % | Freq: Once | TOPICAL | 1 refills | Status: AC
Start: 2022-01-22 — End: ?

## 2022-01-23 ENCOUNTER — Encounter
Admit: 2022-01-23 | Payer: PRIVATE HEALTH INSURANCE | Attending: Student in an Organized Health Care Education/Training Program | Primary: Family

## 2022-01-23 DIAGNOSIS — I456 Pre-excitation syndrome: Secondary | ICD-10-CM

## 2022-01-23 DIAGNOSIS — F32A Depression: Secondary | ICD-10-CM

## 2022-01-23 MED ORDER — HYDROXYZINE HCL 25 MG TABLET
25 mg | Freq: Once | ORAL | Status: CP
Start: 2022-01-23 — End: ?
  Administered 2022-01-23: 17:00:00 25 mg via ORAL

## 2022-01-23 MED ORDER — IVERMECTIN 3 MG TABLET
3 mg | Freq: Once | ORAL | Status: CP
Start: 2022-01-23 — End: ?
  Administered 2022-01-23: 11:00:00 3 mg via ORAL

## 2022-01-23 MED ORDER — HYDROXYZINE HCL 25 MG TABLET
25 mg | Freq: Once | ORAL | Status: CP
Start: 2022-01-23 — End: ?
  Administered 2022-01-23: 10:00:00 25 mg via ORAL

## 2022-01-23 NOTE — Other
[facility] EMERGENCY DEPARTMENT	Emergency Department Psychiatric Evaluation10/25/2023Location of Evaluation (ex: [facility], [facility] Crisis):  [facility] EDJustin [name] is a 31 y.o. Single male.PRESENTATION HISTORY Referred by:  ED PhysicianPatient seen in consultation at the request of:  ED Medicine Attending Dr. Bebe Liter from:  HomeLegal Status on Arrival:  NoneSource of Information:  Patient and Chart/Previous RecordChief Complaint:  HPIProblem(s): hyperverbal per SWTiming:  chronicSeverity:  mildAssociated Signs and Symptoms: anxietyModifying Factors:  mite infestationAccess to Firearms:  Has permit but does not ownContext: 1y M w no documented past psychiatric hx and hx Wolff-Parkinson White requested to be seen by ED SW for pressured speech after presenting to ED for mite infestation. [name] states he has hx anxiety, severe depression (took Valium - did not like, made him itchy, offered prozac - declined, did not want to feel different), and PTSD (denies  in setting of death of father and his twin children at birth. Endorses some anxiety in setting of mite infestation, constant itching, and poor sleep. His psychosocial stressors include recent mite infestation and subsequent housing insecurity, loss of two sets of twins at birth in past few years, caring for wife who had a kidney transplant after birthing premature daughter (now 16m old) w 4x/week dialysis. He has poor sleep in setting of caring for an infant, and more recently, d/t the itching. Appetite varies but is good today. When he goes for a long period of time w/o sleep, he sometimes wonders if there are shadows around. Denies hx mania (grandiosity, distractability, hyperactivity, risky behavior), suicide attempts, past inpt psychiatric hospitalization. He had a therapist Greggory Stallion who he has not seen in a while and is not currently interested in talking to anyone about his mood. His recent PCP is now in jail and does not have a new one. Does not take any medications though he did have an ablation in 2016 for Wolff-Parkinsons. Denies fam hx of psychiatric conditions tho cousin may have bipolar. Denies any family suicide attempts. Denies hx sexual or physical trauma. He maybe has emotional trauma. Denies any nightmares/flashbacks.Works full-time as a Environmental consultant at [employer]. Explains Atarax prescribed by medical ED was very helpful in his anxiety and also helped him w sleep. Originally from [place]. Spoke to wife Ryan [name] (not legally married yet) [phone] denies any concerns for self-harm/suicide/HI. She also denies any hx of manic sxs. Does describe him as a talker and tells her he could probably talk to a wall. Ryan [name] called again stating her mom encouraged to be more honest - discloses that he has narcissistic tendencies and believes he should be in therapy. Explained therapy was offered to the pt.REVIEW OF SYSTEMS Review of Systems Constitutional: Negative for chills and fever. Skin: Positive for rash.      Itch Psychiatric/Behavioral: Negative for confusion, decreased concentration, dysphoric mood, hallucinations and suicidal ideas. The patient is nervous/anxious. The patient is not hyperactive.  OUTPATIENT MEDICATIONS Current Outpatient Medications Medication Sig ? hydrOXYzine Take 1 tablet (25 mg total) by mouth 3 (three) times daily as needed for itching or anxiety. ? ibuprofen  ? ibuprofen TAKE 1 TABLET BY MOUTH EVERY 6 HOURS AS NEEDED WITH FOOD ? lidocaine-prilocaine Apply topically as needed (Patient not taking: Reported on 03/15/2020) ? ondansetron Take 1 tablet (4 mg total) by mouth every 6 (six) hours (Patient not taking: Reported on 03/15/2020) ? permethrin Apply topically once for 1 dose. Medication Comments:  Does not have any regularly prescribed medsHEALTH ISSUES / MEDICAL HISTORY / ALLERGIES  Past Medical History: Diagnosis Date ? WPW (Wolff-Parkinson-White syndrome)   No past surgical history on file.Allergies: Patient has no known allergies.PSYCHIATRIC  TREATMENT HISTORY Psychiatric Treatment History Current Psychiatric Providers (on file): SOCIAL HISTORY Social History Socioeconomic History ? Marital status: Single Tobacco Use ? Smoking status: Never Social Determinants of Health Financial Resource Strain: Low Risk  (01/23/2022)  Overall Financial Resource Strain (CARDIA)  ? Difficulty of Paying Living Expenses: Not very hard Food Insecurity: Food Insecurity Present (01/23/2022)  Hunger Vital Sign  ? Worried About Programme researcher, broadcasting/film/video in the Last Year: Sometimes true  ? Ran Out of Food in the Last Year: Sometimes true Transportation Needs: No Transportation Needs (01/23/2022)  PRAPARE - Transportation  ? Lack of Transportation (Medical): No  ? Lack of Transportation (Non-Medical): No Housing Stability: Medium Risk (01/23/2022)  Housing Stability  ? Housing Stability: I have a place to live today, but I am worried about losing it in the future SUBSTANCE ABUSE HISTORY Alcohol (quantity, last use): has not used in >1yOther substances (quantity, last use): denies cig, opioids, heroine, coke, meth, hallucinogensUsed to use edibles daily but has not used in past 60d d/t financial reasonsFAMILY HISTORY No family history on file.LABORATORY/DIAGNOSTIC RESULTS No results found for this or any previous visit (from the past 24 hour(s)).Relevant laboratory/medical data (from the past 24 hours) includes:  None in recordPHYSICAL EXAM Vitals:  01/23/22 0245 BP: 133/67 Pulse: 81 Resp: 16 Temp: 97.8 ?F (36.6 ?C) Physical ExamI have reviewed the physical exam/work-up performed by Dr. Julien Girt.The patient has been medically cleared by [facility] ED.PSYCHIATRIC EXAMINATION General AppearanceHabitus was thin. Height was tall. Grooming was fair.  MusculoskeletalGait was normal. Station was normal. Strength was normal. Posture was normal. Psychiatric ExaminationAttitude: open, cooperative Psychomotor Behavior: normal Speech: Rate: was hyperverbal (somewhat). Volume: normal  Prosody: normal Mood: all right Affect:  Euthymic Affective Range: full range  Affective Intensity: normal Thought Process:  Coherent and linear Associations: normal Thought Content: no delusions or hallucinations Suicidal Ideation: no current suicidal ideation, plan or intent Violent Ideation: no current violent/homicidal ideation, intent or plan Insight: Good Judgment: Good Cognitive EvaluationSensorium: alert Orientation: oriented to person, oriented to place and oriented to date Attention and Concentration:  EARTH backwards: correct with effortgrossly normalMemory: recall (recent memory) and remote memory intact Language: language intact SAFETY AND RISK ASSESSMENT  C-SSRS (Screen-Most Recent) - 01/23/22 0643    C-SSRS (Screen-Most Recent)  In the past month have you wished you were dead or wished you could go to sleep and not wake up? no  -SD at 01/23/22 1610  In the past month have you actually had any thoughts of killing yourself? no  -SD at 01/23/22 9604  Have you ever done anything, started to do anything, or prepared to do anything to end your life? no  -SD at 01/23/22 5409  [place] Suicide Risk Level low risk  -SD at 01/23/22 0652    User Key  (r) = Recorded By, (t) = Taken By, (c) = Cosigned By  Initials Name  SD Brynda Peon    PSY RISK ASSESSMENT SAFE-T WITH C-SSRS  Reason for Assessment:  Psychiatric Emergency Department EvaluationC-SSRS: Suicidal Ideation:  Past Month  WISH TO BE DEAD:  No  SUICIDAL THOUGHTS:  No  Have you ever done anything, started to do anything or prepared to do anything to end your life?:  NoRisk Assessment:   Access to Lethal Methods:  Yes  Access to Firearms?:  Yes  Specify location, ease of access:  Has permit but does not ownCurrent and Past Psychiatric Diagnoses:    Psychotic Disorder:  No  Suicide Attempt:  No Prior Attempts  Presenting Symptoms:  Anhedonia:  No  Hopelessness or Despair:  No  Anxiety and/or Panic:  Yes  Insomnia:  No  Command Hallucinations:  No  Psychosis:  No  Family History:   Suicide:  No  Precipitants / Stressors:   Pending Incarceration:  No  Housing Insecurity:  Yes  Change in Treatment:    Recent inpatient discharge:  No  Not receiving treatment:  YesProtective Factors:   Internal:   Identifies reasons for living:  Yes  External:   Responsibility to children:  Yes  Supportive social network of friends or family:  Yes  Positive therapeutic relationships / Effective mental healthcare:  No  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 Self - Self-Injurious Behavior:   Current Urges to harm Self:  No  Recent Self-Injury:  No  History of Self Injury:  No  Attitude Regarding Self Injury:  Ego dystonic  Imminent Risk for Self Injury in Community:  Low  Imminent Risk for Self Injury in Facility:  LowRisk to Others:   Current Agitation:  No  Homicidal/Aggressive Ideation:  No  Homicidal/Aggressive Threat/Plan:  No  Recent Violence/Aggression:  No  Attitude Regarding Aggression / Violence:  Ego dystonic  Imminent Risk for Violence in Community:  Low  Imminent Risk for Violence in Facility:  LowWithdrawal Risk:   Alcohol / Benzodiazepines / Barbiturates:  Not applicable  Opioids:  Not applicableIMPRESSION / ASSESSMENT AND PLAN 30yo M no documented psychiatric hx, Wolff-Parkinson s/p ablation 2016, and self-reported depression, anxiety, and PTSD wo flashbacks/nightmares asked to be evaluated by medical ED for hyperverbality. However, upon exam, he does talk a lot but this seems to be a personal style rather than a sx of mania and does not have any other sxs of mania either. Currently, there are no acute psychiatric concerns (denying SI/HI/AH/VH) that warrant any further obs or inpt care. Problem List:Active Hospital Problems  Diagnosis ? Principal Problem/Diagnosis:  Mite infestation [B88.9] The patient was assessed for suicide risk specifically and found to be at LOW risk in the community for suicide.  The patient's risk in the facility is assessed to be  LOW.  Concerning acute risk factors include mite infestation/itching.  The patient has many protective factors which include supportive wife, full-time employement, and 76m old daughter. Suicide Risk is based on the definitions described by the [facility] and Suicide Prevention Resource Center's screening tool, the Suicide Assessment Five-step Evaluation and Triage (SAFE-T) for Mental Health Professionals (see: SAFE_T.pdf).  Based on this assessment, the patient will be: discharged.Legal Status Post Evaluation:  No Legal Status (Observation, Re-Evaluation, Continue on PEER or Discharge) Additional Disposition Information/Plan:  Discharge home, SW to assist w establishing w PCP, ED d/c'ing him w Atarax for itching/anxiety Pearlie Oyster, MDResident10/25/23 1123

## 2022-01-23 NOTE — Plan of Care
Plan of Care Overview/ Patient Status    SOCIAL WORK ASSESSMENTPatient Name: Ryan [name] Record Number: [id number] Date of Birth: [birth year]Medical Social Work Assessment Adult  Loss adjuster, chartered Most Recent Value Requested Accommodations (Leave blank if none requested or patient/family will supply their own hearing devices/glasses)  Requested by (staff name/role) michele barnaby Rendered Accommodations (Leave blank if none rendered or patient/family supplied their own hearing devices/glasses)  Other language interpreter used (non-ASL)? No Admission Information  Document Type Clinical Assessment - Able to Assess (For Inpatient/ED Only) Prior psychosocial assessment has been documented within this hospitalization No (For Inpatient/ED Only) Prior psychosocial assessment has been documented within 30 days of this hospitalization No Reason for Current Social Work Involvement Support/Coping, Home/placement safety concerns, Abuse/Neglect/Violence Mandated Report Required yes I am documenting the submission of a new mandated report of abuse/neglect yes Notified patient/family of mandated referral yes Referral made to Department of Children and Families Date of Report 01/23/22 Time of Report 0644 Mandated referral comment DCF report filed Source of Information Patient, 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 Medical team RN Record Reviewed Yes Level of Care Emergency Department Assessment has been completed within 30 days of this encounter  (For Inpatient/ED Only) Prior psychosocial assessment has been documented within 30 days of this hospitalization No Relationships  Marital Status Committed Relationship Lives With Child(ren), Dependent, Significant other Family circumstances Tayden lives with his child and child's mother in [place] Abuse Screen (yes response referral indicated)  Able to respond to abuse questions Yes Do you Feel That You Are Treated Well By Your Partner/Spouse/Family Member/Caregiver/Employer?  yes What Happens When You Argue/Fight With Your Partner/Spouse/Family Member? none noted Feels Unsafe at Home or Work/School no Feels Threatened by Someone no Does Anyone Try to Keep You From Having Contact with Others or Doing Things Outside Your Home? no Do you have concerns regarding someone you know having access to your MyChart account? no Language needed None, Patient Speaks English Literacy Unable to assess Special Needs none noted Social Determinants of Health  Financial Concerns Limited, but meeting basic needs What is your living situation today? I have a place to live today, but I am worried about losing it in the future Where have you been sleeping? staying with others or in a hotel Think about the place you live. Do you have problems with any of the following? Pests such as bugs, ants, or mice How hard is it for you to pay for the very basics like food, housing, medical care, and heating? Not very hard Within the past 12 months, you worried that your food would run out before you got the money to buy more. Sometimes true Within the past 12 months, the food you bought just didn't last and you didn't have money to get more. Sometimes true In the past 12 months, has lack of transportation kept you from medical appointments or from getting medications? No In the past 12 months, has lack of transportation kept you from meetings, work, or from getting things needed for daily living? No Mental Status  Mental Status Able to Assess Appearance appears stated age Attitude/Demeanor/Rapport pleasant, paranoid Affect (typically observed) accepting, adaptable, guarded, overwhelmed, manic, labile Orientation no deficits recognized Insight Fair Health Insight/Judgment no deficits recognized Reaction to Event/Health Status Anxious Suicide Risk Assessment  Reason for Assessment Utilizing SAFE-T and C-SSRS (Check all that apply) Social Work Consult/Assessment C-SSRS Able to Assess Screening for suicidal ideation within Past Month In the past month have you wished you were dead or wished you  could go to sleep and not wake up? no In the past month have you actually had any thoughts of killing yourself? no Have you ever done anything, started to do anything, or prepared to do anything to end your life? no [place] Suicide Risk Level low risk Specific Questions about Thoughts, Plans, Suicidal Intent (SAFE-T) Negative responses above do not indicate a need for SAFE-T assessment Risk Assessment  Access to Lethal Methods?  (firearm in home or access/presence of other lethal methods) No Risk Assessment Able to Assess Risk to Self Able to Assess Risk to Self - Self-Injurious Behavior None identified Attitudes regarding Self-Injury None disclosed Imminent Risk for Self-Injury in Community Low Imminent Risk for Self-Injury in Facility Low Risk to Others Able to Assess Risk to Others None Disclosed Attitude regarding Aggression / Violence None Disclosed Imminent Risk for Violence in Community Low Imminent Risk for Violence in Facility Low Current and Past Psychiatric Diagnoses Able to Assess Mood Disorder Defer for Further Assessment Anxiety Disorder Defer for Further Assessment Psychotic Disorder Defer for Further Assessment Substance Use Disorder Defer for Further Assessment Post-Traumatic Stress Disorder (PTSD) Defer for Further Assessment Attention Deficit/Hyperactivity Disorder (ADHD) No Traumatic Brain Injury (TBI) No Cluster B Personality Disorders or Traits (i.e. Borderline, Antisocial, Histrionic & Narcissistic) No Conduct Problems (Antisocial Behavior, Aggression, Impulsivity) No Suicide Attempt No Prior Attempts Presenting Symptoms None Family History None reported Precipitants/Stressors Triggering events leading to humiliation, shame, Housing insecurity, Inadequate Social Supports, Stressful Life Events Change in Mental Health or Substance Use Disorder Treatment Not applicable General Risk Factors Recent stress, Problems with family/primary supports, Severe anxiety, Not in treatment General Protective Factors cooperative with interview, able to define needs, identifies reasons for living, future oriented, fear of death or the actual act of killing self, engaged in work or school, responsibility to others This patient was screened using the [place] Suicide Severity Rating Scale (CSSRS)  Yes I conducted a suicide risk assessment including a suicide inquiry and assessment of risk and protective factors, as recommended by the standard Suicide Assessment Five-Step Evaluation and Triage (SAFE-T) for Mental Health Professionals. No, the C-SSRS did not produce a positive screen Cause for concern None Based on my assessment, the level of risk for this patient to suicide in an inpatient or emergency setting is:  MINIMAL because the patient does not present with suicidal ideation, does not have a history of suicide attempts, and the balance of protective factors outweighs any current risk factors Based on my assessment, the level of risk for this patient to suicide in the community is:  MINIMAL Recommended Next Steps Psychiatric evaluation Substance Use  Active substance use Past use Coping  Reaction to Event/Health Status Anxious Needs Assessment   Concerns to be Addressed adjustment to diagnosis/illness, coping/stress, discharge planning Readmission Within the Last 30 Days no previous admission in last 30 days Needs in the Community financial support inadequate, lack of support system/caregiver Anticipated Facility/Agency/Outpatient/Support Group Need(s)  Department of Children and Families Formulation: Recommendation(s) and Intervention(s) (including for discharge to occur)  Psychosocial issues requiring intervention Housing need Psychosocial interventions 45 minutes total time spent with Jill Alexanders to complete clinical assessment. Consult placed ?housing needs?. Chart reviewed including speaking with RN. I introduced myself and my role. During this assessment I utilized active listening, supportive counseling, validation and psychoeducation. At the time of this intervention Franck was caring for his 59-month-old baby as child's mother was being treated in ED as well.   Aubra is a 31 year old, African American male, not married  but in a relationship, father of two minor children, employed full time and hospitalized in observation status after his child?s mother presented to the Emergency Department.   Yeshaya acknowledged that his family has been living in hotels for the past 3 weeks due to bug infestation at there apartment which includes current legal involvement. Bren noted, ?my daughter?s mother has a lot of medical problems due to her kidneys?. He also noted, ?my baby also has medical problems pointing to the feeding tube?. He noted, ?over the past several weeks, we been staying in different hotels including [employer] (points), [employer] and [employer]?, ?I was under the impression that my landlord would reimburse me some of the money paid to hotels, but he hasn?t?Jill Alexanders noted, ?I take good care of my baby, she is a blessing, I been through a lot in my life, I don?t want to talk about it?, ?I know how to care for my child?Jill Alexanders reported that he was born and raised in [place] and has all his family/support there. He noted, ?if I move anywhere, it will be back to [place]?Jill Alexanders presented with pressured speech, labile, anxious and sleep deprived. Jaaziel would benefit from a psychiatric consultation. Brad was informed that this Clinical research associate will be calling DCF to make a report however it was clearly explained to him that the call to [facility] on this writer's part was to petition DCF for support/services. It was agreed that the child has many medical and emotional needs. Vikash's interaction with child was appropriate and his love of child and the importance of providing child a safe/nurturing environment appear to be very important for [place]. The mother of child is scheduled for admission to the medical unit. DCF report made and 136 form will be faxed to department.  [facility] Medical team Specific referrals to enhance community supports (include existing and new resources) pending Handoff Required? Yes Next Steps/Plan (including hand-off): Reason for consult addressed. I will hand this case off to unit social worker for follow up Signature: Brynda Peon, [place], [place] Contact Information: [phone]

## 2022-01-23 NOTE — ED Notes
4:07 AM PATIENTS DUFFLE BAG WITH BELONGINGS IS LOCATED IN HAZMAT ROOM IN LARGE BLUE BAG WITH PT LABEL. PT IS AWARE.

## 2022-01-23 NOTE — ED Provider Notes
Chief Complaint Patient presents with ? Insect Bite   Pt coming from hotel after mite infestation in his apartment 2 weeks ago. Pt's wife is here in ED. 69 month old daughter presents with patient who is also covered in mites. Pedi ED, SW, and OSE involved in case.  A decision regarding hospitalization was made during this visit   Mr Ryan [name] is a 31yo male with past medical history of Wolff-Parkinson-White who presents after exposure to mite infestationPatient states that was living in an apartment that was infested with a bug he could not identify.  States it was biting him and causing excoriations on his skin.  About 2 weeks ago was the last he had stayed in the infested apartment.  States that he has washed off his sheets and clothing daily since then.  Reports that he was seen for this infestation before moving out of his apartment and was given a course of what he believes was permethrin, although he insists that it did not work.Medical decision-making:On assessment presentation is not concerning for ongoing infestation with either scabies, lice, bedbugs, or fleas.  No insect visualized on exam, no active signs of road organisms in his skin and lesions appear as excoriations.  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 to infestation-Atarax for itching and anxiety-We discussed with pharmacy regarding permethrin vs ivermectin, patient inquiring about latter since it was given to family memberADMISSION TO ED OBSERVATIONAdmit to Observation Status: Obs date: 01/23/2022 12:28 AM 65.8 kgIndication for observation: social work resourcesIn addition to serial exams and monitoring of hemodynamics, other anticipated interventions include: social work resources for housingRelevant home medications to be ordered by me and patient handoff given per protocol Discharge criteria: social work resources for housing Physical ExamED Triage VitalsBP: n/aPulse: n/aPulse from  O2 sat: n/aResp: n/aTemp: n/aTemp src: n/aSpO2: n/a BP 124/71  - Pulse 69  - Temp 97.8 ?F (36.6 ?C) (Oral)  - Resp 17  - Wt 65.8 kg  - SpO2 97%  - BMI 19.67 kg/m? Physical ExamHENT:    Head: Normocephalic.    Mouth/Throat:    Pharynx: Oropharynx is clear. Eyes:    Extraocular Movements: Extraocular movements intact. Cardiovascular:    Rate and Rhythm: Normal rate and regular rhythm.    Pulses: Normal pulses.    Heart sounds: Normal heart sounds. Abdominal:    General: Abdomen is flat.    Palpations: Abdomen is soft. Musculoskeletal:       General: Normal range of motion.    Cervical back: Normal range of motion. Skin:   General: Skin is warm and dry.    Comments: Excoriations in various stages of healing, no tracks in the finger webs, no pustules visualized, no organisms on skin Neurological:    Mental Status: He is alert. Psychiatric:    Comments: Anxious, perseverative  ProceduresAttestation/Critical CareComments as of 01/23/22 1610 Tue Jan 22, 2022 [id number] 31 year old male presenting with insect bite. Patient states he's had insect exposures for months in his home. States he was evaluated 2 weeks ago and was given permethrin for presumed scabies without any relief of insect bites. Moved to hotel since then. Patient unable to state what insects look like. Bites are pruritic. Denies fevers, chills, redness, warmth or swelling of skin. Family members with similar skin findings.Concern for scabies however patient also completed course previously and has change in setting. Patient inquiring about further meds, we discussed with pharmacy, ok to prescribe ivermectin. Patient also with issues of housing insecurity iso  leaving home 2/2 presumed mites. We will plan for ed obs for SW evalAttending Supervised: ResidentI saw and examined the patient. I agree with the findings and plan of care as documented in the resident's note. Jolyn Nap, MDDepartment of Emergency Medicine   [DO] Wed Jan 23, 2022 0229 Patient placed in ED obs awaiting social work  [DO]  Comments User Index[DO] Jolyn Nap, MD   Clinical Impressions as of 01/23/22 1610 Housing insecurity Anxiety  ED DispositionDischarge Dorene Ar, MDResident10/25/23 0703 Jolyn Nap, MD10/25/23 1610
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
## 2022-01-23 NOTE — ED Notes
7:27 AM Report received, care assumed.Chief Complaint Patient presents with  Insect Bite   Pt coming from hotel after mite infestation in his apartment 2 weeks ago. Pt's wife is here in ED. 71 month old daughter presents with patient who is also covered in mites. Pedi ED, SW, and OSE involved in case.  9:16 AM No complaints at this time. Diet provided. Pending psych consult. 11:06 AM Stable at this time. Pending discharge plan

## 2022-01-23 NOTE — Discharge Instructions
[facility] HealthDear Mr. Ryan [name], Marana was a pleasure taking care of you during your visit to [facility]. You were seen for bug infestation.  I have sent a script of permethrin cream to your pharmacy, please apply it and leave all of your skin for 14 hours before washing it off.  I have also sent a course of hydroxyzine to your pharmacy to help with itching, anxiety, and it should help with sleep.  Please continue to take your regularly prescribed home medications and make sure to attend your follow up appointments. Best of luck to you in the future.The following medications were added:Please call your doctor or go to the emergency room if any of the following develop:- Chest pain and/or shortness of breath- Fever above 100.4- Vomiting that does not stop- Worsening of your current symptoms- Any other symptoms that you find concerningYou should follow-up with: *If you cannot make any of these appointments, please call the office in advance to reschedule at a time that works best for you*Thank you for trusting your care to [place]. Sincerely,Your Medical Team at [facility]

## 2022-01-23 NOTE — Other
[facility] ED Observation Progress NoteAdmit to Observation Status: Obs date: 01/23/2022 12:28 AMCondition:ImprovedVital signs: Normal  Yes BP 124/71  - Pulse 69  - Temp 97.8 ?F (36.6 ?C) (Oral)  - Resp 17  - Wt 65.8 kg  - SpO2 97%  - BMI 19.67 kg/m? Vital signs reviewed per nursing notesGeneral:  Distress: Appears well, no distressHeart:  normal rateLungs:  Normal respiratory effortNeuro: Alert, normal speechAbdomen: Soft, nontender n/aSkin: Warm and well perfused Yes--------------------------------------------------------------------------------31 year old male history of Wolff-Parkinson-White;  presented to ED after insect infestation at apartment.  Seen previously 2 weeks ago, then given permethrin.  Ongoing infestation therefore in ED. Patient in ED observation for social work evaluation in morning since unable to return back to apartment at this time.Patient signed out to 0700 providerPending social work evaluation and recommendations-T.Morris PA-C--------------------------------------------------------------------------------Response to management: Observation course: ____________________ Wonda Horner, PA10/25/23 0428Patient received as a sign out from [employer] pending social work and psych. Psych was consulted, no intervention required at this time. Discussed with SW, per SW DCF is OK to discharge patient. SW to provide patient with contact information that he should call. The patient obtained his clothing and his belongings, including the infants belongings and car seat.  Discharge with return precautions. Keelie Zemanek Daiva Eves I discussed the case with supervisee, agree with supervisee E/M note with corrections /additions as noted I had face to face interaction with patient. SW consulted, DCF referral for child involvement. SW requests psychiatry consultation. No SI/HI.Donella Stade Tomassoni Leretha Dykes, MD10/25/23 0841 Berton Lan, PA10/25/23 1153 Berton Lan, PA10/25/23 1245

## 2022-01-23 NOTE — ED Notes
11:25 PM Pt to ED with c/o insect bites. Pt reporting living in apartment that has been infested with insects. Pt unsure of insect. Unable to describe. Pt appears anxious. Reporting daughter and wife have been involved. No signs of active bites on skin. Pt does have skin tears, scabs on skin. Pt reporting has not been in apartment for 2x weeks. ED resident at bedside. Exam in progress. Pt has been deconned prior to room arrival. Social work consulted 1:50 AM Pt resting on stretcher. Appears in NAD. No complaints or concerns at this time. Placed in EDOBS for social work consult. Charge RN aware. Patients son remains at bedside. 5:19 AM Social worker at bedside 6:30 AM Pt resting on stretcher. Reporting increased anxiety. PA made aware. Medicated per MAR. Consulting civil engineer at bedside. Spoke with patient. Plan for psych consult. Providers made aware

## 2022-01-23 NOTE — Plan of Care
Plan of Care Overview/ Patient Status    SW Admission Screening  Flowsheet Row Most Recent Value SW Admission Screening  Incomplete emergency contact or next of kin on record No Patient identity unknown No Uninsured/under-insured  No Group home or residential placement No Guardianship or conservatorship No Readmission within 30 days No International residency or residence outside of [place] No Currently on Dialysis No Active Interpersonal Violence/Sexual Assault No Concern for current abuse or neglect No Concern for current homelessness Yes Current behavioral health concerns Yes Concern for active substance use No Screening Result Negative: No current psychosocial barriers to discharge identified at this time.

## 2022-02-01 ENCOUNTER — Inpatient Hospital Stay
Admit: 2022-02-01 | Discharge: 2022-02-01 | Payer: MEDICAID | Attending: Student in an Organized Health Care Education/Training Program

## 2022-02-01 ENCOUNTER — Emergency Department: Admit: 2022-02-01 | Payer: MEDICAID | Primary: Family

## 2022-02-01 DIAGNOSIS — R4182 Altered mental status, unspecified: Secondary | ICD-10-CM

## 2022-02-01 MED ORDER — PERMETHRIN 5 % TOPICAL CREAM
5 % | Freq: Once | TOPICAL | 1 refills | Status: AC
Start: 2022-02-01 — End: ?

## 2022-02-01 NOTE — Discharge Instructions
Please apply the cream as prescribed Please contact with [facility] to obtain primary care physician and also please contact with dermatology office to make an appointment If you have any new symptoms or worsening of symptoms come back to the ED for re-evaluation

## 2022-02-01 NOTE — ED Notes
7:02 PM Provider made aware that pt is refusing labs and IV insertion. Lab called to cancel labs.

## 2022-02-01 NOTE — ED Notes
5:44 PM Patient presenting to ED brought in by EMS following making a scene outside of [employer] screaming and agitated, here for psych eval. Pt also states he has scabes but has been getting treatment for it. He is alert and oriented x4, speaking clearly. Denies any illcit substance or alcohol use. No evidence of acute distress at this time, states he has had decreased PO intake over last week. Denies any other complaints at this time.Chief Complaint Patient presents with  Altered Mental Status   Found screaming outside of [employer], family states pt needs psychiatric help. No hx of. Denies drug use  Past Medical History: Diagnosis Date  Depression   WPW (Wolff-Parkinson-White syndrome)  No past surgical history on file. Vitals:  02/01/22 1735 BP: (!) 153/65 Pulse: (!) 101 Resp: 18 Temp: 98.5 ?F (36.9 ?C) TempSrc: Oral SpO2: 99%   7:06 PM Pt continues to refuse labs at this time, provider aware, pt states he wants to leave AMA7:17 PM Report given to Weston Brass, Charity fundraiser, care transferred

## 2022-02-02 DIAGNOSIS — R4689 Other symptoms and signs involving appearance and behavior: Secondary | ICD-10-CM

## 2022-02-02 DIAGNOSIS — R21 Rash and other nonspecific skin eruption: Principal | ICD-10-CM

## 2022-02-02 DIAGNOSIS — Z207 Contact with and (suspected) exposure to pediculosis, acariasis and other infestations: Secondary | ICD-10-CM

## 2022-02-04 NOTE — ED Provider Notes
Chief Complaint Patient presents with ? Altered Mental Status   Found screaming outside of [employer], family states pt needs psychiatric help. No hx of. Denies drug use HPI/PE:Mr. Ponting is a 31 year old male without significant past medical history is brought in by EMS because he was found outside the [facility] being aggressive and was screaming however patient is not on peer patient denies any SI HI auditory visual hallucination any illicit drug use and upon obtaining the history from the patient he stated his concern about his skin rashes which he thinks it is scabies since his wife was treated for scabies a week ago and is requesting to be also empirically treated for scabies however patient denied any pruritus but he used to have per disease weeks ago.  Otherwise patient denies having any other medical complaints including but not limited to chest pain, shortness of breath, abdominal pain, nausea vomiting, any flu-like symptoms, any urinary symptoms, melena or hematochezia and he denied any recent fall or head trauma.# diffuse skin rashes hyperpigmented macular nonpruriticIt was explained to the patient the fact that the skin rashes are nonpruritic is less likely to be scabies however he insisted on being treated for scabies given the fact that the family members were treated with scabies and they live in poor living environment.  Patient refused any blood workup or dermatology consult any ED but he was agreeable with [place] of the head and chest x-ray to rule out any acute intracranial bleed or infectious process which could contribute to patient's aggression which were grossly unremarkable hence patient was discharged per his request with permethrin cream and he was advised to have follow-up with [facility] and referral to dermatologist was also placed all the RTC precautions were discussed in detail with the patient and he agreed with the plan of care.The case and treatment plan discussed with the ED attending physician Dr. Junie Bame M.DEM Resident Physician [phone] decision regarding hospitalization was made during this visit  Patient does not require admission or further ED Observation at this time  Independent interpretation of: [place] and XRay  Physical ExamED Triage Vitals [02/01/22 1735]BP: (!) 153/65Pulse: (!) 101Pulse from  O2 sat: n/aResp: 18Temp: 98.5 ?F (36.9 ?C)Temp src: OralSpO2: 99 % BP (!) 145/70  - Pulse (!) 95  - Temp 98.6 ?F (37 ?C) (Oral)  - Resp 18  - SpO2 99% Physical ExamVitals and nursing note reviewed. Constitutional:     General: He is not in acute distress.   Appearance: He is well-developed and normal weight. He is not ill-appearing, toxic-appearing or diaphoretic. HENT:    Head: Normocephalic.    Nose: Nose normal. Eyes:    Extraocular Movements: Extraocular movements intact.    Pupils: Pupils are equal, round, and reactive to light. Cardiovascular:    Rate and Rhythm: Normal rate.    Pulses: Normal pulses. Pulmonary:    Effort: Pulmonary effort is normal.    Breath sounds: Normal breath sounds. Abdominal:    General: Abdomen is flat. Bowel sounds are normal.    Palpations: Abdomen is soft. Musculoskeletal:       General: No deformity.    Cervical back: Normal range of motion. Skin:   General: Skin is warm.    Capillary Refill: Capillary refill takes less than 2 seconds.    Comments: Hyperpigmented nonpruritic diffuse skin rashes on torso and back Neurological:    General: No focal deficit present.    Mental Status: He is alert and oriented to person, place, and  time.    GCS: GCS eye subscore is 4. GCS verbal subscore is 5. GCS motor subscore is 6.    Comments: Neurologic exam normal patient is alert oriented x4  ProceduresAttestation/Critical CareComments as of 02/04/22 1730 Fri Feb 01, 2022 1811 Lajean Manes, MDED Attestation: ResidentFace to face evaluation was performed by me in collaboration with the Resident to assess for significant health threats. I provided a substantive portion of the care of this patient.? I personally performed medically appropriate history and physical exam and the MDM.I saw and examined the patient. I agree with the findings and plan of care as documented in the residents note except as noted below.Dictation device and software may have been used during this encounter, please excuse any spelling, word substitution, or punctuation errors.In short:Well appearing, no active distressHyperpig rash, aggressive earlier today, possible scabies exposure? 2nd ED visitPatient declined blood work at this times stating that he strongly believes is scabies since his family was treated for scabies, patient is hemodynamically stable, I offered him dermatology consult, he refused stating that he would rather see Dermatology outside.  Denies any fever or chills.  When I examined patient, patient is able to make his own medical decisions.Patient will be discharged with outpatient follow-up, all questions answered, discharge instructions printed and handed to patient. Patient will return if worsening symptoms or feeling unwell. Patient comfortable and verbalizes agreement with discharge. [JL]  Comments User Index[JL] Lajean Manes, MD   Clinical Impressions as of 02/04/22 1730 Rash  ED DispositionDischarge Lajean Manes, MD11/05/23 1508 Roney Marion, MDResident11/06/23 1308 Lajean Manes, MD11/06/23 1730

## 2022-02-08 ENCOUNTER — Telehealth: Admit: 2022-02-08 | Payer: PRIVATE HEALTH INSURANCE | Attending: Nursing | Primary: Family

## 2022-02-08 NOTE — Telephone Encounter
Left a message to call me back

## 2022-02-13 ENCOUNTER — Encounter: Admit: 2022-02-13 | Payer: PRIVATE HEALTH INSURANCE | Attending: Nursing | Primary: Family

## 2022-02-13 NOTE — Progress Notes
Pt returned my call. Informed that we are not able to start his evaluation as his intended recipient is not on active evaluation at this time.

## 2022-04-09 ENCOUNTER — Encounter: Admit: 2022-04-09 | Payer: PRIVATE HEALTH INSURANCE | Attending: Nursing | Primary: Family

## 2022-04-09 NOTE — Progress Notes
Chart reviewed, intended recipient has not been seen in clinic as of this time.

## 2022-11-20 ENCOUNTER — Telehealth: Admit: 2022-11-20 | Payer: PRIVATE HEALTH INSURANCE | Attending: Nursing | Primary: Family

## 2022-11-20 NOTE — Telephone Encounter
Unable to leave a message, mailbox has not been set up. Case closed intended recipient not listed in our center. Unable to send e mail as no e mail address on file.
